# Patient Record
Sex: Female | Born: 1945
Health system: Southern US, Community
[De-identification: ages and names within clinical notes are randomized; demographics above are authoritative.]

## PROBLEM LIST (undated history)

## (undated) DIAGNOSIS — M21372 Foot drop, left foot: Secondary | ICD-10-CM

## (undated) DIAGNOSIS — R2689 Other abnormalities of gait and mobility: Secondary | ICD-10-CM

## (undated) DIAGNOSIS — E559 Vitamin D deficiency, unspecified: Secondary | ICD-10-CM

## (undated) DIAGNOSIS — R413 Other amnesia: Secondary | ICD-10-CM

## (undated) DIAGNOSIS — K219 Gastro-esophageal reflux disease without esophagitis: Secondary | ICD-10-CM

## (undated) HISTORY — DX: Other amnesia: R41.3

## (undated) HISTORY — PX: CYST EXCISION: SHX5701

## (undated) HISTORY — DX: Foot drop, left foot: M21.372

## (undated) HISTORY — DX: Vitamin D deficiency, unspecified: E55.9

## (undated) HISTORY — DX: Gastro-esophageal reflux disease without esophagitis: K21.9

## (undated) HISTORY — DX: Other abnormalities of gait and mobility: R26.89

## (undated) HISTORY — PX: APPENDECTOMY: SHX54

---

## 1986-12-07 HISTORY — PX: PLACEMENT OF BREAST IMPLANTS: SHX6334

## 1998-04-12 ENCOUNTER — Other Ambulatory Visit: Admission: RE | Admit: 1998-04-12 | Discharge: 1998-04-12 | Payer: Self-pay | Admitting: *Deleted

## 2000-07-02 ENCOUNTER — Other Ambulatory Visit: Admission: RE | Admit: 2000-07-02 | Discharge: 2000-07-02 | Payer: Self-pay | Admitting: Family Medicine

## 2003-10-05 ENCOUNTER — Other Ambulatory Visit: Admission: RE | Admit: 2003-10-05 | Discharge: 2003-10-05 | Payer: Self-pay | Admitting: Family Medicine

## 2005-04-03 ENCOUNTER — Ambulatory Visit: Payer: Self-pay | Admitting: Family Medicine

## 2005-10-28 ENCOUNTER — Ambulatory Visit: Payer: Self-pay | Admitting: Family Medicine

## 2008-06-20 ENCOUNTER — Encounter: Payer: Self-pay | Admitting: Internal Medicine

## 2008-06-27 ENCOUNTER — Telehealth (INDEPENDENT_AMBULATORY_CARE_PROVIDER_SITE_OTHER): Payer: Self-pay | Admitting: *Deleted

## 2009-07-25 ENCOUNTER — Ambulatory Visit: Payer: Self-pay | Admitting: Family Medicine

## 2009-07-25 DIAGNOSIS — D649 Anemia, unspecified: Secondary | ICD-10-CM | POA: Insufficient documentation

## 2009-07-25 DIAGNOSIS — N952 Postmenopausal atrophic vaginitis: Secondary | ICD-10-CM | POA: Insufficient documentation

## 2009-07-25 DIAGNOSIS — E785 Hyperlipidemia, unspecified: Secondary | ICD-10-CM | POA: Insufficient documentation

## 2009-07-29 LAB — CONVERTED CEMR LAB
AST: 18 units/L (ref 0–37)
Albumin: 4.3 g/dL (ref 3.5–5.2)
Alkaline Phosphatase: 55 units/L (ref 39–117)
Basophils Relative: 0.1 % (ref 0.0–3.0)
Bilirubin, Direct: 0.1 mg/dL (ref 0.0–0.3)
CO2: 30 meq/L (ref 19–32)
Calcium: 9.4 mg/dL (ref 8.4–10.5)
Creatinine, Ser: 0.6 mg/dL (ref 0.4–1.2)
Eosinophils Relative: 0.4 % (ref 0.0–5.0)
GFR calc non Af Amer: 106.98 mL/min (ref >60)
Hemoglobin: 13.4 g/dL (ref 12.0–15.0)
Lymphocytes Relative: 29.9 % (ref 12.0–46.0)
Monocytes Relative: 5.1 % (ref 3.0–12.0)
Neutro Abs: 4.5 10*3/uL (ref 1.4–7.7)
Neutrophils Relative %: 64.5 % (ref 43.0–77.0)
RBC: 4.22 M/uL (ref 3.87–5.11)
Sodium: 138 meq/L (ref 135–145)
Total CHOL/HDL Ratio: 4
Total Protein: 7.4 g/dL (ref 6.0–8.3)
Triglycerides: 105 mg/dL (ref 0.0–149.0)
VLDL: 21 mg/dL (ref 0.0–40.0)
WBC: 7 10*3/uL (ref 4.5–10.5)

## 2009-10-09 ENCOUNTER — Ambulatory Visit: Payer: Self-pay | Admitting: Family Medicine

## 2009-11-19 ENCOUNTER — Ambulatory Visit: Payer: Self-pay | Admitting: Family Medicine

## 2009-11-20 ENCOUNTER — Encounter: Payer: Self-pay | Admitting: Family Medicine

## 2009-12-02 ENCOUNTER — Encounter (INDEPENDENT_AMBULATORY_CARE_PROVIDER_SITE_OTHER): Payer: Self-pay | Admitting: *Deleted

## 2009-12-10 ENCOUNTER — Telehealth: Payer: Self-pay | Admitting: Family Medicine

## 2010-01-03 ENCOUNTER — Telehealth: Payer: Self-pay | Admitting: Family Medicine

## 2010-01-15 ENCOUNTER — Ambulatory Visit: Payer: Self-pay | Admitting: Family Medicine

## 2010-02-25 ENCOUNTER — Ambulatory Visit: Payer: Self-pay | Admitting: Family Medicine

## 2010-02-25 LAB — CONVERTED CEMR LAB
ALT: 14 units/L (ref 0–35)
Cholesterol: 240 mg/dL — ABNORMAL HIGH (ref 0–200)
Direct LDL: 162.9 mg/dL
Total CHOL/HDL Ratio: 5
VLDL: 40.8 mg/dL — ABNORMAL HIGH (ref 0.0–40.0)

## 2010-03-04 ENCOUNTER — Ambulatory Visit: Payer: Self-pay | Admitting: Family Medicine

## 2010-03-04 DIAGNOSIS — J310 Chronic rhinitis: Secondary | ICD-10-CM | POA: Insufficient documentation

## 2010-03-11 ENCOUNTER — Ambulatory Visit: Payer: Self-pay | Admitting: Family Medicine

## 2010-03-12 LAB — CONVERTED CEMR LAB: Fecal Occult Bld: NEGATIVE

## 2010-03-25 ENCOUNTER — Telehealth: Payer: Self-pay | Admitting: Family Medicine

## 2010-03-25 ENCOUNTER — Encounter: Payer: Self-pay | Admitting: Family Medicine

## 2010-07-03 ENCOUNTER — Ambulatory Visit: Payer: Self-pay | Admitting: Family Medicine

## 2010-07-04 LAB — CONVERTED CEMR LAB
ALT: 18 units/L (ref 0–35)
AST: 20 units/L (ref 0–37)
Direct LDL: 171.5 mg/dL
HDL: 51.8 mg/dL (ref >39.00)
VLDL: 35.8 mg/dL (ref 0.0–40.0)

## 2010-12-05 ENCOUNTER — Telehealth: Payer: Self-pay | Admitting: Family Medicine

## 2011-01-08 NOTE — Assessment & Plan Note (Signed)
Summary: CONGESTION,BODY ACHES,COUGH/CLE   Vital Signs:  Patient profile:   65 year old female Height:      64.5 inches Weight:      123.50 pounds BMI:     20.95 Temp:     97.9 degrees F oral Pulse rate:   84 / minute Pulse rhythm:   regular BP sitting:   118 / 74  (left arm) Cuff size:   regular  Vitals Entered By: Lewanda Rife LPN (January 15, 2010 1:02 PM)  CC:  chest congestion, body aches, and fever.  History of Present Illness: started sunday with a terrible sore throat in the back  hurt bad to cough- now that is improved fever 101 now - and felt achey and chilled  cough is occas productive -- does sound that way / phlegm is clear  nose is a little runny  ears do no hurt  no n/v/d   grandchildren and father were sick -- exp to a lot     Allergies (verified): No Known Drug Allergies  Past History:  Past Medical History: Last updated: 07/25/2009 Anemia-NOS  Past Surgical History: Last updated: 07/25/2009 Tonsillectomy colposcopy - was normal prior to hyst  Hysterectomy (1998)partial for fibroids ,  cyst in parotid (1986)- L side  breast augmentation (1988)  Family History: Last updated: 07/25/2009 Family History of Arthritis (mother) Family History Diabetes 1st degree relative (MGM) Family History High cholesterol (mother) Family History Hypertension (both parents) Family History Stroke (brother, Maternal grandparent) Family History Heart Disease (Maternal grandparent)  brother - stroke at 20 , and HTN   no cancer in family   Social History: Last updated: 07/25/2009 Retired Married G4P4 Never Smoked Alcohol use-yes- 3-4 times per week  Drug use-yes Regular exercise-yes walks every day - with her dog   Risk Factors: Exercise: yes (07/25/2009)  Risk Factors: Smoking Status: never (07/25/2009)  Review of Systems General:  Complains of chills, fever, loss of appetite, and malaise. Eyes:  Denies blurring and eye irritation. CV:  Denies  chest pain or discomfort and palpitations. Resp:  Complains of cough and sputum productive; denies pleuritic, shortness of breath, and wheezing. GI:  Denies diarrhea, nausea, and vomiting. Derm:  Denies rash.  Physical Exam  General:  fatigued appearing  Head:  normocephalic, atraumatic, and no abnormalities observed.  no sinus tenderness  Eyes:  vision grossly intact, pupils equal, pupils round, pupils reactive to light, and no injection.   Ears:  R ear normal and L ear normal.   Nose:  nares injeted and congested  Mouth:  pharynx pink and moist, no erythema, no exudates, and postnasal drip.   Neck:  No deformities, masses, or tenderness noted. Lungs:  Normal respiratory effort, chest expands symmetrically. Lungs are clear to auscultation, no crackles or wheezes. Heart:  Normal rate and regular rhythm. S1 and S2 normal without gallop, murmur, click, rub or other extra sounds. Skin:  Intact without suspicious lesions or rashes Cervical Nodes:  No lymphadenopathy noted Psych:  normal affect, talkative and pleasant    Impression & Recommendations:  Problem # 1:  INFLUENZA, WITH RESPIRATORY SYMPTOMS (ICD-487.1) Assessment New in pt who had flu shot  out of window for tamiflu will control symptoms with guifen- cod cough syrup recommend sympt care- see pt instructions   pt advised to update me if symptoms worsen or do not improve - esp if worse prod cough or fever   Complete Medication List: 1)  Aspirin 81 Mg Tabs (Aspirin) .... Take 1 tablet  by mouth once a day 2)  Multivitamins Tabs (Multiple vitamin) .... By mouth daily 3)  Vitamin E 400 Unit Caps (vitamin E)  .... By mouth daily 4)  Vitamin D 2000 Unit Tabs (Cholecalciferol) .... Take 1 tablet by mouth once a day 5)  Calcium 1500 Mg Tabs (Calcium carbonate) .... Take 1 tablet by mouth once a day 6)  Omega-3 Cf 1000 Mg Caps (Omega-3 fatty acids) .... 3 caps by mouth daily 7)  Premarin 0.625 Mg/gm Crea (Estrogens, conjugated) ....  Use as directed intravaginally .5 g 2-3 times per week 8)  Flonase 50 Mcg/act Susp (Fluticasone propionate) .... 2 sprays in each nostril once daily as needed 9)  Ibuprofen 200 Mg Tabs (Ibuprofen) .... Otc as directed. 10)  Guaifenesin-codeine 100-10 Mg/49ml Soln (Guaifenesin-codeine) .Marland Kitchen.. 1-2 teaspoons by mouth up to every 4 hours as needed cough  Patient Instructions: 1)  get lots of fluids and rest  2)  nasal saline spray or netti pot is good for congestion 3)  use your flonase daily for next 1-2 weeks  4)  try the guifen- codiene cough syrup with caution because it can sedate  5)  tylenol  and ibuprofen over the counter as directed may help with aches, headache and fever 6)  call if symptoms worsen or if not improved in 4-5 days  Prescriptions: GUAIFENESIN-CODEINE 100-10 MG/5ML SOLN (GUAIFENESIN-CODEINE) 1-2 teaspoons by mouth up to every 4 hours as needed cough  #120cc x 0   Entered and Authorized by:   Judith Part MD   Signed by:   Judith Part MD on 01/15/2010   Method used:   Print then Give to Patient   RxID:   [id number]   Current Allergies (reviewed today): No known allergies

## 2011-01-08 NOTE — Progress Notes (Signed)
Summary: please clarify instructions for premarin  Phone Note From Pharmacy   Caller: [facility] [phone] [place] Dec) Summary of Call: Pharmacy need clarification on the instructions for pt's premarin cream.  They need the gram weight that she is to use 2-3 times a week. Initial call taken by: Lowella Petties CMA,  January 03, 2010 4:14 PM  Follow-up for Phone Call        I think it is dispensed in 42.5 grams  let me know if that is not correct Follow-up by: Judith Part MD,  January 03, 2010 4:35 PM  Additional Follow-up for Phone Call Additional follow up Details #1::        Thats right for the tube size but they need to know the exact amount that the pt is to use each time.  The instructions only say a small amount.  Please advise.  thanks-- .5 grams 2-3 times per week--MT Additional Follow-up by: Lowella Petties CMA,  January 03, 2010 4:44 PM    Additional Follow-up for Phone Call Additional follow up Details #2::    Advised pharmacist. Follow-up by: Lowella Petties CMA,  January 04, 2010 9:45 AM  New/Updated Medications: PREMARIN 0.625 MG/GM CREA (ESTROGENS, CONJUGATED) use as directed intravaginally .5 g 2-3 times per week

## 2011-01-08 NOTE — Letter (Signed)
Summary: [place] [facility]: Immunoassay Fecal Occult Blood (iFOB) Order Form  [name] at [facility]  [address] [place], [place] [address]   Phone: [phone]  Fax: [phone]      [facility]: Immunoassay Fecal Occult Blood (iFOB) Order Form   March 04, 2010 MRN: [id number]   Carol Chavez 18-Mar-1946   Physicican Name:_____Tower____________________  Diagnosis Code:___________V76.41_______________      Judith Part MD

## 2011-01-08 NOTE — Progress Notes (Signed)
Summary: Sinus/Ears  Phone Note Call from Patient Call back at Home Phone [phone] Call back at cell [phone]   Caller: Patient Call For: Judith Part MD Summary of Call: pt states she seen you back in Dec for sinus infection which is better, but her left ear is still stopped up. Pt would like to know if she should see ENT? I asked pt if she was using nasal spray and she said no. Please advise Initial call taken by: Mervin Hack CMA Duncan Dull),  December 10, 2009 10:20 AM  Follow-up for Phone Call        I want her to try some flonase ns for a week and then update me again-- if not imp will ref to ENT update me if ear pain or fever develop px written on EMR for call in  Follow-up by: Judith Part MD,  December 10, 2009 10:46 AM  Additional Follow-up for Phone Call Additional follow up Details #1::        Advised pt, med called to [facility]. Additional Follow-up by: Lowella Petties CMA,  December 10, 2009 11:03 AM    New/Updated Medications: FLONASE 50 MCG/ACT SUSP (FLUTICASONE PROPIONATE) 2 sprays in each nostril once daily Prescriptions: FLONASE 50 MCG/ACT SUSP (FLUTICASONE PROPIONATE) 2 sprays in each nostril once daily  #1 mdi x 1   Entered and Authorized by:   Judith Part MD   Signed by:   Lowella Petties CMA on 12/10/2009   Method used:   Telephoned to ...         RxID:   [id number]

## 2011-01-08 NOTE — Assessment & Plan Note (Signed)
Summary: CPX.Marland KitchenCYD   Vital Signs:  Patient profile:   65 year old female Height:      64.5 inches Weight:      124.25 pounds BMI:     21.07 Temp:     98.3 degrees F oral Pulse rate:   84 / minute Pulse rhythm:   regular BP sitting:   124 / 80  (left arm) Cuff size:   regular  Vitals Entered By: Lewanda Rife LPN (March 04, 2010 2:33 PM) CC: complete physical LMP Hyst 1998   History of Present Illness: here for health mt exam and to review chronic med problems   is feeling good overall - no new complaints   occasional sinus headaches - needs to stay on flonase   wt is stable with bmi 21- good  bp 124/80- also good   partial hyst in past no symptoms or problems  pap 07  mam 12/10- was normal  self exam - no lumps or changes   bone density-- has never had  takes her ca and vit D   TD 05 did get her flu shot this year   colon screen - has not had colonosc yet  may consider next fall  will do stool screen   recent lipids higher with trig 204/ HDL 49 and LDL 162- up from the 140s  tries to eat a healthy diet  thinks this is genetic    shingles - had the vaccine yesterday - at a pharmacy   Allergies (verified): No Known Drug Allergies  Past History:  Past Medical History: Last updated: 07/25/2009 Anemia-NOS  Family History: Last updated: 07/25/2009 Family History of Arthritis (mother) Family History Diabetes 1st degree relative (MGM) Family History High cholesterol (mother) Family History Hypertension (both parents) Family History Stroke (brother, Maternal grandparent) Family History Heart Disease (Maternal grandparent)  brother - stroke at 24 , and HTN   no cancer in family   Social History: Last updated: 07/25/2009 Retired Married G4P4 Never Smoked Alcohol use-yes- 3-4 times per week  Drug use-yes Regular exercise-yes walks every day - with her dog   Risk Factors: Exercise: yes (07/25/2009)  Risk Factors: Smoking Status: never  (07/25/2009)  Past Surgical History: Tonsillectomy colposcopy - was normal prior to hyst  Hysterectomy (1998)partial for fibroids ,  cyst in parotid (1986)- L side  breast augmentation (1988) cataract surgery 2010   Review of Systems General:  Denies fatigue, fever, loss of appetite, and malaise. Eyes:  Denies blurring and eye irritation. ENT:  Complains of nasal congestion, postnasal drainage, and sinus pressure; denies earache and sore throat. CV:  Denies chest pain or discomfort and palpitations. Resp:  Denies cough and wheezing. GI:  Denies abdominal pain, bloody stools, change in bowel habits, and indigestion. MS:  Denies joint pain, joint redness, and joint swelling. Derm:  Denies itching, lesion(s), poor wound healing, and rash. Neuro:  Complains of headaches; denies numbness and tingling. Psych:  Denies anxiety and depression. Endo:  Denies cold intolerance, excessive thirst, excessive urination, and heat intolerance. Heme:  Denies abnormal bruising and bleeding.  Physical Exam  General:  fatigued appearing  Head:  normocephalic, atraumatic, and no abnormalities observed.  no sinus tenderness  Eyes:  vision grossly intact, pupils equal, pupils round, pupils reactive to light, and no injection.   Ears:  R ear normal and L ear normal.   Nose:  nares are boggy and mildly congested  Mouth:  pharynx pink and moist, no erythema, no exudates, and postnasal drip.  Neck:  supple with full rom and no masses or thyromegally, no JVD or carotid bruit  Chest Wall:  No deformities, masses, or tenderness noted. Breasts:  No mass, nodules, thickening, tenderness, bulging, retraction, inflamation, nipple discharge or skin changes noted.  (implants noted) Lungs:  Normal respiratory effort, chest expands symmetrically. Lungs are clear to auscultation, no crackles or wheezes. Heart:  Normal rate and regular rhythm. S1 and S2 normal without gallop, murmur, click, rub or other extra  sounds. Abdomen:  Bowel sounds positive,abdomen soft and non-tender without masses, organomegaly or hernias noted. no renal bruits  Msk:  No deformity or scoliosis noted of thoracic or lumbar spine.  some mild changes of DIP OA in hands  Pulses:  R and L carotid,radial,femoral,dorsalis pedis and posterior tibial pulses are full and equal bilaterally Extremities:  No clubbing, cyanosis, edema, or deformity noted with normal full range of motion of all joints.   Neurologic:  sensation intact to light touch, gait normal, and DTRs symmetrical and normal.   Skin:  Intact without suspicious lesions or rashes Cervical Nodes:  No lymphadenopathy noted Axillary Nodes:  No palpable lymphadenopathy Inguinal Nodes:  No significant adenopathy Psych:  normal affect, talkative and pleasant    Impression & Recommendations:  Problem # 1:  HEALTH MAINTENANCE EXAM (ICD-V70.0) Assessment Comment Only reviewed health habits including diet, exercise and skin cancer prevention reviewed health maintenance list and family history overall good habits  imms up to date  stool immunoassay for screen  she may consider colonosc in future- disc pros and cons sched fasting wellness lab 6 wk  Problem # 2:  HYPERLIPIDEMIA, MILD (ICD-272.4) Assessment: Deteriorated  cholesterol up - suspect familial will try zocor 20 and update if side eff lab 6 wk rev low sat fat diet and handout given from [facility] also  Her updated medication list for this problem includes:    Zocor 20 Mg Tabs (Simvastatin) .Marland Kitchen... 1 by mouth each evening with a low fat snack  Labs Reviewed: SGOT: 17 (02/25/2010)   SGPT: 14 (02/25/2010)   HDL:49.70 (02/25/2010), 48.50 (07/25/2009)  Chol:240 (02/25/2010), 211 (07/25/2009)  Trig:204.0 (02/25/2010), 105.0 (07/25/2009)  Problem # 3:  RHINITIS (ICD-472.0) Assessment: Deteriorated becoming more year around with congestion and ETD willl inc flonase to daily use and update if not imp  Complete  Medication List: 1)  Aspirin 81 Mg Tabs (Aspirin) .... Take 1 tablet by mouth once a day 2)  Multivitamins Tabs (Multiple vitamin) .... By mouth daily 3)  Vitamin E 400 Unit Caps (vitamin E)  .... By mouth daily 4)  Vitamin D 2000 Unit Tabs (Cholecalciferol) .... Take 1 tablet by mouth once a day 5)  Calcium 1500 Mg Tabs (Calcium carbonate) .... Take 1 tablet by mouth once a day 6)  Omega-3 Cf 1000 Mg Caps (Omega-3 fatty acids) .... 3 caps by mouth daily 7)  Premarin 0.625 Mg/gm Crea (Estrogens, conjugated) .... Use as directed intravaginally .5 g 2-3 times per week 8)  Flonase 50 Mcg/act Susp (Fluticasone propionate) .... 2 sprays in each nostril once daily as needed 9)  Ibuprofen 200 Mg Tabs (Ibuprofen) .... Otc as directed. 10)  Zocor 20 Mg Tabs (Simvastatin) .Marland Kitchen.. 1 by mouth each evening with a low fat snack  Patient Instructions: 1)  try zocor 20 mg one pill each evening for hereditary high cholesterol  2)  if any side effects or problems- stop it and let me know  3)  schedule fasting labs in 6 weeks wellness/ lipid v70/0  and 272  4)  the current recommendation for calcium intake is 1200-1500 mg daily with [phone] IU of vitamin D  5)  next november - call us to schedule your mammogram and also first dexa (bone density test)  Prescriptions: ZOCOR 20 MG TABS (SIMVASTATIN) 1 by mouth each evening with a low fat snack  #30 x 11   Entered and Authorized by:   Judith Part MD   Signed by:   Judith Part MD on 03/04/2010   Method used:   Print then Give to Patient   RxID:   [id number] FLONASE 50 MCG/ACT SUSP (FLUTICASONE PROPIONATE) 2 sprays in each nostril once daily as needed  #1 mdi x 11   Entered and Authorized by:   Judith Part MD   Signed by:   Judith Part MD on 03/04/2010   Method used:   Print then Give to Patient   RxID:   [id number]   Current Allergies (reviewed today): No known allergies      Other Immunization History:    Zostavax # 1:   Zostavax (03/03/2010)

## 2011-01-08 NOTE — Progress Notes (Signed)
Summary: Premarin .625mg   Phone Note Refill Request Call back at [phone] Message from:  [facility] on December 05, 2010 3:27 PM  Refills Requested: Medication #1:  PREMARIN 0.625 MG/GM CREA use as directed intravaginally .5 g 2-3 times per week [facility] electronically requested refill for Premarin0.625 cream. No last refill date sent. Med is on med list but I could not see where we had prescribed before. Pt had CPX 03/04/10 Please advise.    Method Requested: Electronic Initial call taken by: Lewanda Rife LPN,  December 05, 2010 3:29 PM  Follow-up for Phone Call        px written on EMR for call in  Follow-up by: Judith Part MD,  December 05, 2010 3:50 PM    New/Updated Medications: PREMARIN 0.625 MG/GM CREA (ESTROGENS, CONJUGATED) use as directed intravaginally .5 g 2-3 times per week Prescriptions: PREMARIN 0.625 MG/GM CREA (ESTROGENS, CONJUGATED) use as directed intravaginally .5 g 2-3 times per week  #3 months x 3   Entered by:   Lowella Petties CMA, AAMA   Authorized by:   Judith Part MD   Signed by:   Lowella Petties CMA, AAMA on 12/05/2010   Method used:   Electronically to        [facility]       [address]       [place], [place]  [address]       Ph: [phone]       Fax: [phone]   RxID:   [id number]

## 2011-01-08 NOTE — Miscellaneous (Signed)
Summary: Zocor update  Clinical Lists Changes  Medications: Removed medication of ZOCOR 20 MG TABS (SIMVASTATIN) 1 by mouth each evening with a low fat snack     Current Allergies: No known allergies

## 2011-01-08 NOTE — Progress Notes (Signed)
Summary: didnt start zocor  Phone Note Call from Patient Call back at Home Phone [phone] Call back at [phone]   Caller: Patient Summary of Call: Pt has decided not to start zocor, so she cancelled her lab appt for may.  She is asking if she can have fasting labs drawn in july.  Please order. Initial call taken by: Lowella Petties CMA,  March 25, 2010 11:07 AM  Follow-up for Phone Call        ok- will remove from med list watch diet  labs for july lipid/ast/alt 272  Follow-up by: Judith Part MD,  March 25, 2010 11:14 AM  Additional Follow-up for Phone Call Additional follow up Details #1::        Patient notified as instructed by telephone. Fasting lab appointment scheduled as instructed 06/16/10 at 8:10am.removed Zocor from med list as instructed. Lewanda Rife LPN  March 25, 2010 3:56 PM

## 2012-08-09 ENCOUNTER — Ambulatory Visit (INDEPENDENT_AMBULATORY_CARE_PROVIDER_SITE_OTHER): Payer: MEDICARE | Admitting: Internal Medicine

## 2012-08-09 ENCOUNTER — Ambulatory Visit: Payer: Self-pay

## 2012-08-09 ENCOUNTER — Encounter: Payer: Self-pay | Admitting: Internal Medicine

## 2012-08-09 VITALS — BP 136/89 | HR 101 | Temp 97.9°F | Resp 24 | Ht 64.5 in | Wt 124.0 lb

## 2012-08-09 DIAGNOSIS — M79673 Pain in unspecified foot: Secondary | ICD-10-CM

## 2012-08-09 DIAGNOSIS — M25569 Pain in unspecified knee: Secondary | ICD-10-CM

## 2012-08-09 DIAGNOSIS — Z23 Encounter for immunization: Secondary | ICD-10-CM

## 2012-08-09 DIAGNOSIS — M79643 Pain in unspecified hand: Secondary | ICD-10-CM

## 2012-08-09 DIAGNOSIS — IMO0002 Reserved for concepts with insufficient information to code with codable children: Secondary | ICD-10-CM

## 2012-08-09 DIAGNOSIS — M79609 Pain in unspecified limb: Secondary | ICD-10-CM

## 2012-08-09 DIAGNOSIS — T148XXA Other injury of unspecified body region, initial encounter: Secondary | ICD-10-CM

## 2012-08-09 MED ORDER — MUPIROCIN 2 % EX OINT: TOPICAL_OINTMENT | Freq: Three times a day (TID) | CUTANEOUS | Status: AC

## 2012-08-09 MED ORDER — HYDROCODONE-ACETAMINOPHEN 5-500 MG PO TABS: 1.0000 | ORAL_TABLET | Freq: Three times a day (TID) | ORAL | Status: AC | PRN
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
## 2012-08-09 NOTE — Patient Instructions (Signed)
Abrasions Abrasions are skin scrapes. Their treatment depends on how large and deep the abrasion is. Abrasions do not extend through all layers of the skin. A cut or lesion through all skin layers is called a laceration. HOME CARE INSTRUCTIONS   If you were given a dressing, change it at least once a day or as instructed by your caregiver. If the bandage sticks, soak it off with a solution of water or hydrogen peroxide.   Twice a day, wash the area with soap and water to remove all the cream/ointment. You may do this in a sink, under a tub faucet, or in a shower. Rinse off the soap and pat dry with a clean towel. Look for signs of infection (see below).   Reapply cream/ointment according to your caregiver's instruction. This will help prevent infection and keep the bandage from sticking. Telfa or gauze over the wound and under the dressing or wrap will also help keep the bandage from sticking.   If the bandage becomes wet, dirty, or develops a foul smell, change it as soon as possible.   Only take over-the-counter or prescription medicines for pain, discomfort, or fever as directed by your caregiver.  SEEK IMMEDIATE MEDICAL CARE IF:   Increasing pain in the wound.   Signs of infection develop: redness, swelling, surrounding area is tender to touch, or pus coming from the wound.   You have a fever.   Any foul smell coming from the wound or dressing.  Most skin wounds heal within ten days. Facial wounds heal faster. However, an infection may occur despite proper treatment. You should have the wound checked for signs of infection within 24 to 48 hours or sooner if problems arise. If you were not given a wound-check appointment, look closely at the wound yourself on the second day for early signs of infection listed above. MAKE SURE YOU:   Understand these instructions.   Will watch your condition.   Will get help right away if you are not doing well or get worse.  Document Released:  09/02/2005 Document Revised: 11/12/2011 Document Reviewed: 10/27/2011 [employer] Patient Information 2012 [employer]. 

## 2012-08-09 NOTE — Progress Notes (Signed)
Verbal consent obtained. Wounds along bilateral posterior shoulders, elbows, left anterior chest, left forearm, bilateral wrists, hands, both dorsal and volar surfaces, right knee, bilateral feet, and toes debrided. No deep structures involved in any of the above sites. Full sensation and ROM observed and exhibited in the above locations. Patient was given Xanax 0.25 mg and Lortab Elixer 10 cc during the procedure. The above wounds were also washed with chloroscrub and rinsed. Wounds dressed, xeroform applied to wounds along right posterior shoulder, left volar hand, and right foot. Patient to perform daily dressing changes and follow up in three days.   Eula Listen, PA-C 08/09/2012 12:31 PM

## 2012-08-12 ENCOUNTER — Ambulatory Visit (INDEPENDENT_AMBULATORY_CARE_PROVIDER_SITE_OTHER): Payer: Medicare Other | Admitting: Family Medicine

## 2012-08-12 VITALS — BP 124/78 | HR 89 | Temp 98.2°F | Resp 16 | Ht 64.0 in | Wt 128.0 lb

## 2012-08-12 DIAGNOSIS — T1490XA Injury, unspecified, initial encounter: Secondary | ICD-10-CM
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
## 2012-11-24 ENCOUNTER — Encounter: Payer: Self-pay | Admitting: Family Medicine

## 2012-11-28 ENCOUNTER — Encounter: Payer: Self-pay | Admitting: *Deleted

## 2013-04-04 ENCOUNTER — Other Ambulatory Visit: Payer: Self-pay

## 2013-05-25 ENCOUNTER — Telehealth: Payer: Self-pay | Admitting: Family Medicine

## 2013-05-25 DIAGNOSIS — D649 Anemia, unspecified: Secondary | ICD-10-CM

## 2013-05-25 DIAGNOSIS — E785 Hyperlipidemia, unspecified: Secondary | ICD-10-CM

## 2013-05-25 DIAGNOSIS — Z0000 Encounter for general adult medical examination without abnormal findings: Secondary | ICD-10-CM

## 2013-05-25 NOTE — Telephone Encounter (Signed)
Message copied by Judy Pimple on Thu May 25, 2013  4:06 PM ------      Message from: Alvina Chou      Created: Tue May 23, 2013  2:45 PM      Regarding: Lab orders for Tuesday, July 1.14       Patient is scheduled for CPX labs, please order future labs, Thanks , Terri             ------

## 2013-06-06 ENCOUNTER — Other Ambulatory Visit (INDEPENDENT_AMBULATORY_CARE_PROVIDER_SITE_OTHER): Payer: Medicare Other

## 2013-06-06 DIAGNOSIS — D649 Anemia, unspecified: Secondary | ICD-10-CM

## 2013-06-06 DIAGNOSIS — E785 Hyperlipidemia, unspecified: Secondary | ICD-10-CM

## 2013-06-06 DIAGNOSIS — Z0000 Encounter for general adult medical examination without abnormal findings: Secondary | ICD-10-CM

## 2013-06-06 LAB — CBC WITH DIFFERENTIAL/PLATELET
Basophils Absolute: 0 10*3/uL (ref 0.0–0.1)
Eosinophils Relative: 1.4 % (ref 0.0–5.0)
HCT: 39.4 % (ref 36.0–46.0)
Lymphocytes Relative: 45.3 % (ref 12.0–46.0)
Lymphs Abs: 2.5 10*3/uL (ref 0.7–4.0)
Monocytes Relative: 8.5 % (ref 3.0–12.0)
Neutrophils Relative %: 44.2 % (ref 43.0–77.0)
Platelets: 203 10*3/uL (ref 150.0–400.0)
RDW: 13.8 % (ref 11.5–14.6)
WBC: 5.5 10*3/uL (ref 4.5–10.5)

## 2013-06-06 LAB — COMPREHENSIVE METABOLIC PANEL
ALT: 15 U/L (ref 0–35)
Albumin: 4.3 g/dL (ref 3.5–5.2)
CO2: 25 mEq/L (ref 19–32)
Calcium: 9.2 mg/dL (ref 8.4–10.5)
Chloride: 99 mEq/L (ref 96–112)
GFR: 91.5 mL/min (ref >60.00)
Glucose, Bld: 102 mg/dL — ABNORMAL HIGH (ref 70–99)
Potassium: 4.2 mEq/L (ref 3.5–5.1)
Sodium: 134 mEq/L — ABNORMAL LOW (ref 135–145)
Total Bilirubin: 0.7 mg/dL (ref 0.3–1.2)
Total Protein: 7.3 g/dL (ref 6.0–8.3)

## 2013-06-06 LAB — LDL CHOLESTEROL, DIRECT: Direct LDL: 165.9 mg/dL

## 2013-06-06 LAB — LIPID PANEL
Cholesterol: 246 mg/dL — ABNORMAL HIGH (ref 0–200)
VLDL: 33.8 mg/dL (ref 0.0–40.0)

## 2013-06-13 ENCOUNTER — Ambulatory Visit (INDEPENDENT_AMBULATORY_CARE_PROVIDER_SITE_OTHER): Payer: Medicare Other | Admitting: Family Medicine

## 2013-06-13 ENCOUNTER — Encounter: Payer: Self-pay | Admitting: Family Medicine

## 2013-06-13 VITALS — BP 124/72 | HR 92 | Temp 98.7°F | Ht 64.25 in | Wt 126.8 lb

## 2013-06-13 DIAGNOSIS — Z0000 Encounter for general adult medical examination without abnormal findings: Secondary | ICD-10-CM

## 2013-06-13 DIAGNOSIS — E785 Hyperlipidemia, unspecified: Secondary | ICD-10-CM

## 2013-06-13 DIAGNOSIS — N952 Postmenopausal atrophic vaginitis: Secondary | ICD-10-CM

## 2013-06-13 DIAGNOSIS — Z23 Encounter for immunization: Secondary | ICD-10-CM

## 2013-06-13 DIAGNOSIS — Z1211 Encounter for screening for malignant neoplasm of colon: Secondary | ICD-10-CM

## 2013-06-13 MED ORDER — FLUTICASONE PROPIONATE 50 MCG/ACT NA SUSP: 2.0000 | Freq: Every day | NASAL | Status: DC

## 2013-06-13 MED ORDER — ESTROGENS, CONJUGATED 0.625 MG/GM VA CREA: TOPICAL_CREAM | Freq: Every day | VAGINAL | Status: DC

## 2013-06-13 NOTE — Assessment & Plan Note (Signed)
Pt declines colonoscopy - but will do IFOB card  No stool changes or symptoms

## 2013-06-13 NOTE — Assessment & Plan Note (Signed)
Pt was given premarin vaginal cream for prn use - twice weekly small amt -disc poss side effects  Will update if not helpful

## 2013-06-13 NOTE — Assessment & Plan Note (Signed)
Reviewed health habits including diet and exercise and skin cancer prevention Also reviewed health mt list, fam hx and immunizations  See HPI Wellness labs rev in detail

## 2013-06-13 NOTE — Assessment & Plan Note (Signed)
Pt declines medication for this Disc goals for lipids and reasons to control them Rev labs with pt Rev low sat fat diet in detail  Handout given as well

## 2013-06-13 NOTE — Progress Notes (Signed)
Subjective:    Patient ID: Carol Chavez, female    DOB: 01/17/1946, 67 y.o.   MRN: [id number]  HPI I have personally reviewed the Medicare Annual Wellness questionnaire and have noted 1. The patient's medical and social history 2. Their use of alcohol, tobacco or illicit drugs 3. Their current medications and supplements 4. The patient's functional ability including ADL's, fall risks, home safety risks and hearing or visual             impairment. 5. Diet and physical activities 6. Evidence for depression or mood disorders  The patients weight, height, BMI have been recorded in the chart and visual acuity is per eye clinic.  I have made referrals, counseling and provided education to the patient based review of the above and I have provided the pt with a written personalized care plan for preventive services.  See scanned forms.  Routine anticipatory guidance given to patient.  See health maintenance. Flu- got a shot this fall  Shingles 3/11 vaccine PNA- will get that today Tetanus 9/13 vaccine Colon -never had a colonoscopy - declines that - and will do IFOB instead   Breast cancer screening-mammogram 12/13  No lumps on self exam  Gyn exam - has been since 2007  No gyn symptoms (baseline atrophic vag - not using her premarin cream)- because she is not sexually active, no new partners (she wants to avoid a speculum exam due to pain from vaginal dryness) Wants to refill premarin cream in case she does become sexually active again    Advance directive- does not have , will consider working on a living will  Cognitive function addressed- see scanned forms- and if abnormal then additional documentation follows. -no concerns at all with that   PMH and SH reviewed  Meds, vitals, and allergies reviewed.   ROS: See HPI.  Otherwise negative.     Falls none   Mood good overall / no depression or lack of motivation   Labs  Sodium level 134 - had a lot of water that am  Sugar 102    Cholesterol Lab Results  Component Value Date   CHOL 246* 06/06/2013   CHOL 257* 07/03/2010   CHOL 240* 02/25/2010   Lab Results  Component Value Date   HDL 45.60 06/06/2013   HDL 51.80 07/03/2010   HDL 40.98 02/25/2010   No results found for this basename: LDLCALC   Lab Results  Component Value Date   TRIG 169.0* 06/06/2013   TRIG 179.0* 07/03/2010   TRIG 204.0* 02/25/2010   Lab Results  Component Value Date   CHOLHDL 5 06/06/2013   CHOLHDL 5 07/03/2010   CHOLHDL 5 02/25/2010   Lab Results  Component Value Date   LDLDIRECT 165.9 06/06/2013   LDLDIRECT 171.5 07/03/2010   LDLDIRECT 162.9 02/25/2010   was on vacation before she had this checked  She declines medication now or in the future No fast food and not a lot of sugar  occ red meat - 2 times per month, no fried foods , occ eggs, some 2% milk , breakfast meat once per week , some shellfish    Patient Active Problem List   Diagnosis Date Noted  . Encounter for Medicare annual wellness exam 06/13/2013  . RHINITIS 03/04/2010  . HYPERLIPIDEMIA, MILD 07/25/2009  . ANEMIA-NOS 07/25/2009  . VAGINITIS, ATROPHIC, POSTMENOPAUSAL 07/25/2009   No past medical history on file. No past surgical history on file. History  Substance Use Topics  . Smoking status:  Never Smoker   . Smokeless tobacco: Not on file  . Alcohol Use: Yes     Comment: occ   No family history on file. No Known Allergies No current outpatient prescriptions on file prior to visit.   No current facility-administered medications on file prior to visit.    Review of Systems Review of Systems  Constitutional: Negative for fever, appetite change, fatigue and unexpected weight change.  Eyes: Negative for pain and visual disturbance.  Respiratory: Negative for cough and shortness of breath.   Cardiovascular: Negative for cp or palpitations    Gastrointestinal: Negative for nausea, diarrhea and constipation.  Genitourinary: Negative for urgency and frequency.   Skin: Negative for pallor or rash   Neurological: Negative for weakness, light-headedness, numbness and headaches.  Hematological: Negative for adenopathy. Does not bruise/bleed easily.  Psychiatric/Behavioral: Negative for dysphoric mood. The patient is not nervous/anxious.         Objective:   Physical Exam  Constitutional: She appears well-developed and well-nourished. No distress.  HENT:  Head: Normocephalic and atraumatic.  Right Ear: External ear normal.  Left Ear: External ear normal.  Nose: Nose normal.  Mouth/Throat: Oropharynx is clear and moist.  Eyes: Conjunctivae and EOM are normal. Pupils are equal, round, and reactive to light. Right eye exhibits no discharge. Left eye exhibits no discharge. No scleral icterus.  Neck: Normal range of motion. Neck supple. No JVD present. Carotid bruit is not present. No thyromegaly present.  Cardiovascular: Normal rate, regular rhythm, normal heart sounds and intact distal pulses.  Exam reveals no gallop.   Pulmonary/Chest: Effort normal and breath sounds normal. No respiratory distress. She has no wheezes. She has no rales.  Abdominal: Soft. Bowel sounds are normal. She exhibits no distension, no abdominal bruit and no mass. There is no tenderness.  Genitourinary: No breast swelling, tenderness, discharge or bleeding. There is no rash on the right labia. There is no rash on the left labia. No erythema or bleeding around the vagina. No vaginal discharge found.  Atrophic vaginal mucosa noted without ulceration or breakdown - external exam  Breast exam: No mass, nodules, thickening, tenderness, bulging, retraction, inflamation, nipple discharge or skin changes noted.  No axillary or clavicular LA.  Chaperoned exam.    Musculoskeletal: She exhibits no edema and no tenderness.  Lymphadenopathy:    She has no cervical adenopathy.  Neurological: She is alert. She has normal reflexes. No cranial nerve deficit. She exhibits normal muscle tone.  Coordination normal.  Skin: Skin is warm and dry. No rash noted. No erythema. No pallor.  Psychiatric: She has a normal mood and affect.          Assessment & Plan:

## 2013-06-13 NOTE — Patient Instructions (Addendum)
Avoid red meat/ fried foods/ egg yolks/ fatty breakfast meats/ butter, cheese and high fat dairy/ and shellfish   Pneumonia vaccine today  Please do IFOB card for colon cancer screening  Think about working on a living will

## 2013-10-04 ENCOUNTER — Other Ambulatory Visit (INDEPENDENT_AMBULATORY_CARE_PROVIDER_SITE_OTHER): Payer: Medicare Other

## 2013-10-04 DIAGNOSIS — Z1211 Encounter for screening for malignant neoplasm of colon: Secondary | ICD-10-CM

## 2013-10-04 LAB — FECAL OCCULT BLOOD, IMMUNOCHEMICAL: Fecal Occult Bld: NEGATIVE

## 2013-10-12 ENCOUNTER — Other Ambulatory Visit: Payer: Self-pay

## 2014-01-19 ENCOUNTER — Encounter: Payer: Self-pay | Admitting: Family Medicine

## 2014-09-21 ENCOUNTER — Other Ambulatory Visit: Payer: Self-pay

## 2015-06-03 ENCOUNTER — Other Ambulatory Visit: Payer: Self-pay

## 2015-07-18 ENCOUNTER — Encounter: Payer: Self-pay | Admitting: Family Medicine

## 2016-04-26 ENCOUNTER — Telehealth: Payer: Self-pay | Admitting: Family Medicine

## 2016-04-26 DIAGNOSIS — R739 Hyperglycemia, unspecified: Secondary | ICD-10-CM

## 2016-04-26 DIAGNOSIS — R7303 Prediabetes: Secondary | ICD-10-CM | POA: Insufficient documentation

## 2016-04-26 DIAGNOSIS — E785 Hyperlipidemia, unspecified: Secondary | ICD-10-CM

## 2016-04-26 DIAGNOSIS — E871 Hypo-osmolality and hyponatremia: Secondary | ICD-10-CM | POA: Insufficient documentation

## 2016-04-26 NOTE — Telephone Encounter (Signed)
-----   Message from Alvina Chouerri J Walsh sent at 04/24/2016 11:56 AM EDT ----- Regarding: Lab orders for Tuesday, 5.30.17 Lab orders for a f/u

## 2016-05-05 ENCOUNTER — Ambulatory Visit: Payer: Medicare Other

## 2016-05-05 ENCOUNTER — Other Ambulatory Visit (INDEPENDENT_AMBULATORY_CARE_PROVIDER_SITE_OTHER): Payer: Medicare Other

## 2016-05-05 DIAGNOSIS — Z1159 Encounter for screening for other viral diseases: Secondary | ICD-10-CM | POA: Diagnosis not present

## 2016-05-05 DIAGNOSIS — R739 Hyperglycemia, unspecified: Secondary | ICD-10-CM

## 2016-05-05 DIAGNOSIS — Z729 Problem related to lifestyle, unspecified: Secondary | ICD-10-CM

## 2016-05-05 DIAGNOSIS — E871 Hypo-osmolality and hyponatremia: Secondary | ICD-10-CM

## 2016-05-05 DIAGNOSIS — E785 Hyperlipidemia, unspecified: Secondary | ICD-10-CM

## 2016-05-05 LAB — COMPREHENSIVE METABOLIC PANEL
ALT: 14 U/L (ref 0–35)
AST: 15 U/L (ref 0–37)
Albumin: 4.3 g/dL (ref 3.5–5.2)
Alkaline Phosphatase: 56 U/L (ref 39–117)
BILIRUBIN TOTAL: 0.4 mg/dL (ref 0.2–1.2)
BUN: 13 mg/dL (ref 6–23)
CALCIUM: 9.8 mg/dL (ref 8.4–10.5)
CO2: 28 meq/L (ref 19–32)
CREATININE: 0.67 mg/dL (ref 0.40–1.20)
Chloride: 102 mEq/L (ref 96–112)
GFR: 92.29 mL/min (ref >60.00)
Glucose, Bld: 103 mg/dL — ABNORMAL HIGH (ref 70–99)
Potassium: 4.4 mEq/L (ref 3.5–5.1)
Sodium: 135 mEq/L (ref 135–145)
Total Protein: 6.8 g/dL (ref 6.0–8.3)

## 2016-05-05 LAB — HEMOGLOBIN A1C: Hgb A1c MFr Bld: 6 % (ref 4.6–6.5)

## 2016-05-05 LAB — CBC WITH DIFFERENTIAL/PLATELET
BASOS PCT: 0.5 % (ref 0.0–3.0)
Basophils Absolute: 0 10*3/uL (ref 0.0–0.1)
EOS ABS: 0.1 10*3/uL (ref 0.0–0.7)
EOS PCT: 1 % (ref 0.0–5.0)
HEMATOCRIT: 39.4 % (ref 36.0–46.0)
HEMOGLOBIN: 13.3 g/dL (ref 12.0–15.0)
LYMPHS PCT: 42.5 % (ref 12.0–46.0)
Lymphs Abs: 2.7 10*3/uL (ref 0.7–4.0)
MCHC: 33.7 g/dL (ref 30.0–36.0)
MCV: 90.2 fl (ref 78.0–100.0)
Monocytes Absolute: 0.4 10*3/uL (ref 0.1–1.0)
Monocytes Relative: 7 % (ref 3.0–12.0)
Neutro Abs: 3.1 10*3/uL (ref 1.4–7.7)
Neutrophils Relative %: 49 % (ref 43.0–77.0)
Platelets: 222 10*3/uL (ref 150.0–400.0)
RBC: 4.37 Mil/uL (ref 3.87–5.11)
RDW: 13.8 % (ref 11.5–15.5)
WBC: 6.4 10*3/uL (ref 4.0–10.5)

## 2016-05-05 LAB — LIPID PANEL
CHOL/HDL RATIO: 4
Cholesterol: 235 mg/dL — ABNORMAL HIGH (ref 0–200)
HDL: 53.9 mg/dL (ref >39.00)
LDL CALC: 148 mg/dL — AB (ref 0–99)
NonHDL: 181.36
TRIGLYCERIDES: 169 mg/dL — AB (ref 0.0–149.0)
VLDL: 33.8 mg/dL (ref 0.0–40.0)

## 2016-05-05 LAB — TSH: TSH: 2.93 u[IU]/mL (ref 0.35–4.50)

## 2016-05-05 NOTE — Addendum Note (Signed)
Addended by: Liane ComberHAVERS, Jahn Franchini C on: 05/05/2016 01:10 PM   Modules accepted: Orders

## 2016-05-06 LAB — HEPATITIS C ANTIBODY: HCV Ab: NEGATIVE

## 2016-05-15 ENCOUNTER — Ambulatory Visit (INDEPENDENT_AMBULATORY_CARE_PROVIDER_SITE_OTHER): Payer: Medicare Other

## 2016-05-15 VITALS — BP 122/76 | HR 75 | Temp 97.9°F | Ht 64.0 in | Wt 124.8 lb

## 2016-05-15 DIAGNOSIS — Z0000 Encounter for general adult medical examination without abnormal findings: Secondary | ICD-10-CM

## 2016-05-15 NOTE — Progress Notes (Signed)
Pre visit review using our clinic review tool, if applicable. No additional management support is needed unless otherwise documented below in the visit note. 

## 2016-05-15 NOTE — Progress Notes (Signed)
Subjective:   Carol Chavez is a 70 y.o. female who presents for Medicare Annual (Subsequent) preventive examination.  Review of Systems:  N/A Cardiac Risk Factors include: advanced age (>20men, >77 women);dyslipidemia     Objective:     Vitals: BP 122/76 mmHg  Pulse 75  Temp(Src) 97.9 F (36.6 C) (Oral)  Ht  (1.626 m)  Wt 124 lb 12 oz (56.586 kg)  BMI 21.40 kg/m2  SpO2 97%  Body mass index is 21.4 kg/(m^2).   Tobacco History  Smoking status  . Never Smoker   Smokeless tobacco  . Not on file     Counseling given: No   History reviewed. No pertinent past medical history. History reviewed. No pertinent past surgical history. History reviewed. No pertinent family history. History  Sexual Activity  . Sexual Activity: No    Outpatient Encounter Prescriptions as of 05/15/2016  Medication Sig  . conjugated estrogens (PREMARIN) vaginal cream Place vaginally daily. Use 2 cm of cream intravaginally 2 times per week (Patient not taking: Reported on 05/15/2016)  . fluticasone (FLONASE) 50 MCG/ACT nasal spray Place 2 sprays into the nose daily. As needed (Patient not taking: Reported on 05/15/2016)   No facility-administered encounter medications on file as of 05/15/2016.    Activities of Daily Living In your present state of health, do you have any difficulty performing the following activities: 05/15/2016  Hearing? Y  Vision? N  Difficulty concentrating or making decisions? N  Walking or climbing stairs? N  Dressing or bathing? N  Doing errands, shopping? N  Preparing Food and eating ? N  Using the Toilet? N  Do you have problems with loss of bowel control? N  Managing your Medications? N  Managing your Finances? N  Housekeeping or managing your Housekeeping? N    Patient Care Team: Judy Pimple, MD as PCP - General    Assessment:     Hearing Screening           Right ear:   0 0 0 0   Left ear:   0 0 0 0     Visual  Acuity Screening   Right eye Left eye Both eyes  Without correction:  With correction:     Comments: Last eye exam approx. 4 yrs ago   Exercise Activities and Dietary recommendations Current Exercise Habits: Home exercise routine, Type of exercise: walking, Time (Minutes): 60, Frequency (Times/Week): 7, Weekly Exercise (Minutes/Week): 420, Intensity: Moderate, Exercise limited by: None identified  Goals    . Increase physical activity     Starting 05/15/16, I will continue to walk at least 60 min 7 days per week.       Fall Risk Fall Risk  05/15/2016  Falls in the past year? No   Depression Screen PHQ 2/9 Scores 05/15/2016  PHQ - 2 Score 0     Cognitive Testing MMSE - Mini Mental State Exam 05/15/2016  Orientation to time 5  Orientation to Place 5  Registration 3  Attention/ Calculation 0  Recall 3  Language- name 2 objects 0  Language- repeat 1  Language- follow 3 step command 3  Language- read & follow direction 0  Write a sentence 0  Copy design 0  Total score 20   PLEASE NOTE: A Mini-Cog screen was completed. Maximum score is 20. A value of 0 denotes this part of Folstein MMSE was not completed or the patient failed this part of the Mini-Cog screening.  Mini-Cog Screening Orientation to Time - Max 5 pts Orientation to Place - Max 5 pts Registration - Max 3 pts Recall - Max 3 pts Language Repeat - Max 1 pts Language Follow 3 Step Command - Max 3 pts  Immunization History  Administered Date(s) Administered  . Influenza Whole 10/09/2009  . Influenza, High Dose Seasonal PF 10/08/2015  . Pneumococcal Conjugate-13 10/08/2015  . Pneumococcal Polysaccharide-23 06/13/2013  . Td 12/08/2003  . Tdap 08/09/2012  . Zoster 03/03/2010   Screening Tests Health Maintenance  Topic Date Due  . DEXA SCAN  05/15/2017 (Originally 08/05/2010)  . COLONOSCOPY  06/14/2023 (Originally 08/06/1995)  . INFLUENZA VACCINE  07/07/2016  . MAMMOGRAM  07/17/2016  .  TETANUS/TDAP  08/09/2022  . ZOSTAVAX  Completed  . Hepatitis C Screening  Completed  . PNA vac Low Risk Adult  Completed      Plan:     I have personally reviewed and addressed the Medicare Annual Wellness questionnaire and have noted the following in the patient's chart:  A. Medical and social history B. Use of alcohol, tobacco or illicit drugs  C. Current medications and supplements D. Functional ability and status E.  Nutritional status F.  Physical activity G. Advance directives H. List of other physicians I.  Hospitalizations, surgeries, and ER visits in previous 12 months J.  Vitals K. Screenings to include hearing, vision, cognitive, depression L. Referrals and appointments - none  In addition, I have reviewed and discussed with patient certain preventive protocols, quality metrics, and best practice recommendations. A written personalized care plan for preventive services as well as general preventive health recommendations were provided to patient.  See attached scanned questionnaire for additional information.   Signed,   Randa EvensLesia Anita Mcadory, MHA, BS, LPN Health Advisor

## 2016-05-15 NOTE — Patient Instructions (Signed)
[name] , Thank you for taking time to come for your Medicare Wellness Visit. I appreciate your ongoing commitment to your health goals. Please review the following plan we discussed and let me know if I can assist you in the future.   These are the goals we discussed: Goals    . Increase physical activity     Starting 05/15/16, I will continue to walk at least 60 min 7 days per week.        This is a list of the screening recommended for you and due dates:  Health Maintenance  Topic Date Due  . DEXA scan (bone density measurement)  05/15/2017*  . Colon Cancer Screening  06/14/2023*  . Flu Shot  07/07/2016  . Mammogram  07/17/2016  . Tetanus Vaccine  08/09/2022  . Shingles Vaccine  Completed  .  Hepatitis C: One time screening is recommended by Center for Disease Control  (CDC) for  adults born from [birth year] through 1965.   Completed  . Pneumonia vaccines  Completed  *Topic was postponed. The date shown is not the original due date.    Preventive Care for Adults  A healthy lifestyle and preventive care can promote health and wellness. Preventive health guidelines for adults include the following key practices.  . A routine yearly physical is a good way to check with your health care provider about your health and preventive screening. It is a chance to share any concerns and updates on your health and to receive a thorough exam.  . Visit your dentist for a routine exam and preventive care every 6 months. Brush your teeth twice a day and floss once a day. Good oral hygiene prevents tooth decay and gum disease.  . The frequency of eye exams is based on your age, health, family medical history, use  of contact lenses, and other factors. Follow your health care provider's ecommendations for frequency of eye exams.  . Eat a healthy diet. Foods like vegetables, fruits, whole grains, low-fat dairy products, and lean protein foods contain the nutrients you need without too many calories.  Decrease your intake of foods high in solid fats, added sugars, and salt. Eat the right amount of calories for you. Get information about a proper diet from your health care provider, if necessary.  . Regular physical exercise is one of the most important things you can do for your health. Most adults should get at least 150 minutes of moderate-intensity exercise (any activity that increases your heart rate and causes you to sweat) each week. In addition, most adults need muscle-strengthening exercises on 2 or more days a week.  Silver Sneakers may be a benefit available to you. To determine eligibility, you may visit the website: www.silversneakers.com or contact program at 83211955731-347-616-8324 Mon-Fri between 8AM-8PM.   . Maintain a healthy weight. The body mass index (BMI) is a screening tool to identify possible weight problems. It provides an estimate of body fat based on height and weight. Your health care provider can find your BMI and can help you achieve or maintain a healthy weight.   For adults 20 years and older: ? A BMI below 18.5 is considered underweight. ? A BMI of 18.5 to 24.9 is normal. ? A BMI of 25 to 29.9 is considered overweight. ? A BMI of 30 and above is considered obese.   . Maintain normal blood lipids and cholesterol levels by exercising and minimizing your intake of saturated fat. Eat a balanced diet with plenty  of fruit and vegetables. Blood tests for lipids and cholesterol should begin at age 43 and be repeated every 5 years. If your lipid or cholesterol levels are high, you are over 50, or you are at high risk for heart disease, you may need your cholesterol levels checked more frequently. Ongoing high lipid and cholesterol levels should be treated with medicines if diet and exercise are not working.  . If you smoke, find out from your health care provider how to quit. If you do not use tobacco, please do not start.  . If you choose to drink alcohol, please do not consume  more than 2 drinks per day. One drink is considered to be 12 ounces (355 mL) of beer, 5 ounces (148 mL) of wine, or 1.5 ounces (44 mL) of liquor.  . If you are 32-47 years old, ask your health care provider if you should take aspirin to prevent strokes.  . Use sunscreen. Apply sunscreen liberally and repeatedly throughout the day. You should seek shade when your shadow is shorter than you. Protect yourself by wearing long sleeves, pants, a wide-brimmed hat, and sunglasses year round, whenever you are outdoors.  . Once a month, do a whole body skin exam, using a mirror to look at the skin on your back. Tell your health care provider of new moles, moles that have irregular borders, moles that are larger than a pencil eraser, or moles that have changed in shape or color.

## 2016-05-15 NOTE — Progress Notes (Signed)
PCP notes:  Health maintenance:  Bone density - postponed/pt will discuss with PCP at next appt Colon cancer screening - pt had declined colonoscopy; education provided to pt about other options such as Cologuard and FOBT/pt will discuss these options with PCP at next appt  Abnormal screenings:  Hearing - failed  Patient concerns: None  Nurse concerns: None  Next PCP appt: 06/30/16 @ 0800   I reviewed health advisor's note, was available for consultation, and agree with documentation and plan.

## 2016-05-19 ENCOUNTER — Ambulatory Visit: Payer: Medicare Other | Admitting: Family Medicine

## 2016-06-30 ENCOUNTER — Ambulatory Visit: Payer: Medicare Other | Admitting: Family Medicine

## 2016-07-27 ENCOUNTER — Ambulatory Visit (INDEPENDENT_AMBULATORY_CARE_PROVIDER_SITE_OTHER): Payer: Medicare Other | Admitting: Family Medicine

## 2016-07-27 ENCOUNTER — Encounter: Payer: Self-pay | Admitting: Family Medicine

## 2016-07-27 VITALS — BP 114/70 | HR 72 | Temp 98.4°F | Ht 64.25 in | Wt 123.8 lb

## 2016-07-27 DIAGNOSIS — E785 Hyperlipidemia, unspecified: Secondary | ICD-10-CM | POA: Diagnosis not present

## 2016-07-27 DIAGNOSIS — R739 Hyperglycemia, unspecified: Secondary | ICD-10-CM

## 2016-07-27 DIAGNOSIS — Z0000 Encounter for general adult medical examination without abnormal findings: Secondary | ICD-10-CM

## 2016-07-27 DIAGNOSIS — Z1211 Encounter for screening for malignant neoplasm of colon: Secondary | ICD-10-CM

## 2016-07-27 MED ORDER — ESTRADIOL 0.1 MG/GM VA CREA: TOPICAL_CREAM | VAGINAL | 5 refills | Status: DC

## 2016-07-27 NOTE — Patient Instructions (Addendum)
Don't forget to schedule [facility] at [facility]  Take care of yourself  Continue calcium and D  Here is a px for estrace cream  Keep walking

## 2016-07-27 NOTE — Assessment & Plan Note (Signed)
Reviewed health habits including diet and exercise and skin cancer prevention Reviewed appropriate screening tests for age  Also reviewed health mt list, fam hx and immunization status , as well as social and family history   See HPI Labs reviewed [facility] reviewed Don't forget to [facility]  Take care of yourself  Continue calcium and D  Here is a px for estrace cream  Keep walking

## 2016-07-27 NOTE — Assessment & Plan Note (Signed)
Disc goals for lipids and reasons to control them Rev labs with pt Rev low sat fat diet in detail slt improvement  Declines treatment  Will keep working on diet No longer taking red yeast rice

## 2016-07-27 NOTE — Progress Notes (Signed)
Pre visit review using our clinic review tool, if applicable. No additional management support is needed unless otherwise documented below in the visit note. 

## 2016-07-27 NOTE — Assessment & Plan Note (Signed)
Lab Results  Component Value Date   HGBA1C 6.0 05/05/2016   Disc imp of wt control and low glycemic diet to prevent DM2

## 2016-07-27 NOTE — Assessment & Plan Note (Signed)
Declines a colonoscopy Given info on cologuard to look at -she may consider it Will call if she wishes to order No symptoms

## 2016-07-27 NOTE — Progress Notes (Signed)
Subjective:    Patient ID: Carol SledgeMARSHA J Holben, female    DOB: 07/12/1946, 70 y.o.   MRN: [id number]  HPI Here for f/u of chronic medical problems   Has been doing well overall   Saw Lesia for her AMW visit in June  Did fail hearing exam She notices some decrease in hearing from the past  Not really ready for hearing eval  Or hearing aides yet   Declined colonoscopy  May be interested in cologuard -will read the info and let us know   Put off dexa-not interested in one  No falls or fractures  Takes her calcium and vitamin D  She walks for exercise every day   Mammogram was 8/16- has not scheduled it yet-she will do that on line - goes to [facility]  Self exam- no lumps or changes   Wt Readings from Last 3 Encounters:  07/27/16 123 lb 12 oz (56.1 kg)  05/15/16 124 lb 12 oz (56.6 kg)  06/13/13 126 lb 12 oz (57.5 kg)  walks daily  Eats well  bmi is 21  Hx of hyperglycemia Lab Results  Component Value Date   HGBA1C 6.0 05/05/2016  watching this  Watches her sugar and carbs  MGM had DM later in life  She stays slim and walks    The patient has a history of of hyperlipidemia Lab Results  Component Value Date   CHOL 235 (H) 05/05/2016   HDL 53.90 05/05/2016   LDLCALC 148 (H) 05/05/2016   LDLDIRECT 165.9 06/06/2013   TRIG 169.0 (H) 05/05/2016   CHOLHDL 4 05/05/2016    Improved a bit - inc HDL and dec LDL  Watching diet  Declines medication  She had stopped red yeast rice - and then took coconut oil   She has vaginal dryness- atrophy  Was prev on estrogen cream  Wants to try it again   Patient Active Problem List   Diagnosis Date Noted  . Routine general medical examination at a health care facility 07/27/2016  . Hyponatremia 04/26/2016  . Hyperglycemia 04/26/2016  . Encounter for Medicare annual wellness exam 06/13/2013  . Colon cancer screening 06/13/2013  . RHINITIS 03/04/2010  . Hyperlipidemia 07/25/2009  . VAGINITIS, ATROPHIC, POSTMENOPAUSAL 07/25/2009   No  past medical history on file. No past surgical history on file. Social History  Substance Use Topics  . Smoking status: Never Smoker  . Smokeless tobacco: Never Used  . Alcohol use Yes     Comment: occ   No family history on file. No Known Allergies No current outpatient prescriptions on file prior to visit.   No current facility-administered medications on file prior to visit.     Review of Systems    Review of Systems  Constitutional: Negative for fever, appetite change, fatigue and unexpected weight change.  Eyes: Negative for pain and visual disturbance.  Respiratory: Negative for cough and shortness of breath.   Cardiovascular: Negative for cp or palpitations    Gastrointestinal: Negative for nausea, diarrhea and constipation.  Genitourinary: Negative for urgency and frequency.  Skin: Negative for pallor or rash   Neurological: Negative for weakness, light-headedness, numbness and headaches.  Hematological: Negative for adenopathy. Does not bruise/bleed easily.  Psychiatric/Behavioral: Negative for dysphoric mood. The patient is not nervous/anxious.      Objective:   Physical Exam  Constitutional: She appears well-developed and well-nourished. No distress.  Well appearing   HENT:  Head: Normocephalic and atraumatic.  Right Ear: External ear normal.  Left  Ear: External ear normal.  Mouth/Throat: Oropharynx is clear and moist.  Eyes: Conjunctivae and EOM are normal. Pupils are equal, round, and reactive to light. No scleral icterus.  Neck: Normal range of motion. Neck supple. No JVD present. Carotid bruit is not present. No thyromegaly present.  Cardiovascular: Normal rate, regular rhythm, normal heart sounds and intact distal pulses.  Exam reveals no gallop.   Pulmonary/Chest: Effort normal and breath sounds normal. No respiratory distress. She has no wheezes. She exhibits no tenderness.  Abdominal: Soft. Bowel sounds are normal. She exhibits no distension, no abdominal  bruit and no mass. There is no tenderness.  Genitourinary: No breast swelling, tenderness, discharge or bleeding.  Genitourinary Comments: Breast implants noted bilaterally  Breast exam: No mass, nodules, thickening, tenderness, bulging, retraction, inflamation, nipple discharge or skin changes noted.  No axillary or clavicular LA.      Musculoskeletal: Normal range of motion. She exhibits no edema or tenderness.  Lymphadenopathy:    She has no cervical adenopathy.  Neurological: She is alert. She has normal reflexes. No cranial nerve deficit. She exhibits normal muscle tone. Coordination normal.  Skin: Skin is warm and dry. No rash noted. No erythema. No pallor.  SKs and angiomas diffusely  Psychiatric: She has a normal mood and affect.          Assessment & Plan:   Problem List Items Addressed This Visit      Other   Routine general medical examination at a health care facility    Reviewed health habits including diet and exercise and skin cancer prevention Reviewed appropriate screening tests for age  Also reviewed health mt list, fam hx and immunization status , as well as social and family history   See HPI Labs reviewed AMW reviewed Don't forget to schedule your mammogram at [facility]  Take care of yourself  Continue calcium and D  Here is a px for estrace cream  Keep walking       Hyperlipidemia - Primary    Disc goals for lipids and reasons to control them Rev labs with pt Rev low sat fat diet in detail slt improvement  Declines treatment  Will keep working on diet No longer taking red yeast rice       Hyperglycemia    Lab Results  Component Value Date   HGBA1C 6.0 05/05/2016   Disc imp of wt control and low glycemic diet to prevent DM2      Colon cancer screening    Declines a colonoscopy Given info on cologuard to look at -she may consider it Will call if she wishes to order No symptoms       Other Visit Diagnoses   None.

## 2016-09-14 ENCOUNTER — Encounter: Payer: Self-pay | Admitting: Family Medicine

## 2016-09-14 DIAGNOSIS — Z1231 Encounter for screening mammogram for malignant neoplasm of breast: Secondary | ICD-10-CM | POA: Diagnosis not present

## 2016-10-02 DIAGNOSIS — Z23 Encounter for immunization: Secondary | ICD-10-CM | POA: Diagnosis not present

## 2017-03-26 DIAGNOSIS — H5212 Myopia, left eye: Secondary | ICD-10-CM | POA: Diagnosis not present

## 2017-04-16 DIAGNOSIS — H26491 Other secondary cataract, right eye: Secondary | ICD-10-CM | POA: Diagnosis not present

## 2017-04-16 DIAGNOSIS — Z961 Presence of intraocular lens: Secondary | ICD-10-CM | POA: Diagnosis not present

## 2017-04-16 DIAGNOSIS — H40013 Open angle with borderline findings, low risk, bilateral: Secondary | ICD-10-CM | POA: Diagnosis not present

## 2017-04-20 DIAGNOSIS — Z961 Presence of intraocular lens: Secondary | ICD-10-CM | POA: Diagnosis not present

## 2017-04-20 DIAGNOSIS — H40013 Open angle with borderline findings, low risk, bilateral: Secondary | ICD-10-CM | POA: Diagnosis not present

## 2017-04-20 DIAGNOSIS — H26491 Other secondary cataract, right eye: Secondary | ICD-10-CM | POA: Diagnosis not present

## 2017-07-18 DIAGNOSIS — M79672 Pain in left foot: Secondary | ICD-10-CM | POA: Diagnosis not present

## 2017-07-19 DIAGNOSIS — S92345A Nondisplaced fracture of fourth metatarsal bone, left foot, initial encounter for closed fracture: Secondary | ICD-10-CM | POA: Diagnosis not present

## 2017-08-11 DIAGNOSIS — S92345D Nondisplaced fracture of fourth metatarsal bone, left foot, subsequent encounter for fracture with routine healing: Secondary | ICD-10-CM | POA: Diagnosis not present

## 2017-09-08 DIAGNOSIS — S92345D Nondisplaced fracture of fourth metatarsal bone, left foot, subsequent encounter for fracture with routine healing: Secondary | ICD-10-CM | POA: Diagnosis not present

## 2017-10-06 DIAGNOSIS — S92345D Nondisplaced fracture of fourth metatarsal bone, left foot, subsequent encounter for fracture with routine healing: Secondary | ICD-10-CM | POA: Diagnosis not present

## 2017-12-13 DIAGNOSIS — M79672 Pain in left foot: Secondary | ICD-10-CM | POA: Diagnosis not present

## 2017-12-17 DIAGNOSIS — M79672 Pain in left foot: Secondary | ICD-10-CM | POA: Diagnosis not present

## 2017-12-21 DIAGNOSIS — M79672 Pain in left foot: Secondary | ICD-10-CM | POA: Diagnosis not present

## 2017-12-24 DIAGNOSIS — M79672 Pain in left foot: Secondary | ICD-10-CM | POA: Diagnosis not present

## 2017-12-27 DIAGNOSIS — Z1231 Encounter for screening mammogram for malignant neoplasm of breast: Secondary | ICD-10-CM | POA: Diagnosis not present

## 2018-03-07 ENCOUNTER — Ambulatory Visit (INDEPENDENT_AMBULATORY_CARE_PROVIDER_SITE_OTHER): Payer: Medicare Other | Admitting: Family Medicine

## 2018-03-07 ENCOUNTER — Encounter: Payer: Self-pay | Admitting: Family Medicine

## 2018-03-07 VITALS — BP 128/78 | HR 73 | Temp 97.5°F | Ht 64.0 in | Wt 127.5 lb

## 2018-03-07 DIAGNOSIS — Z0000 Encounter for general adult medical examination without abnormal findings: Secondary | ICD-10-CM

## 2018-03-07 DIAGNOSIS — N952 Postmenopausal atrophic vaginitis: Secondary | ICD-10-CM | POA: Diagnosis not present

## 2018-03-07 DIAGNOSIS — Z0001 Encounter for general adult medical examination with abnormal findings: Secondary | ICD-10-CM | POA: Diagnosis not present

## 2018-03-07 DIAGNOSIS — E7800 Pure hypercholesterolemia, unspecified: Secondary | ICD-10-CM

## 2018-03-07 DIAGNOSIS — R739 Hyperglycemia, unspecified: Secondary | ICD-10-CM

## 2018-03-07 DIAGNOSIS — Z1211 Encounter for screening for malignant neoplasm of colon: Secondary | ICD-10-CM

## 2018-03-07 LAB — LIPID PANEL
CHOL/HDL RATIO: 4
CHOLESTEROL: 188 mg/dL (ref 0–200)
HDL: 50 mg/dL (ref >39.00)
LDL Cholesterol: 111 mg/dL — ABNORMAL HIGH (ref 0–99)
NonHDL: 137.83
TRIGLYCERIDES: 135 mg/dL (ref 0.0–149.0)
VLDL: 27 mg/dL (ref 0.0–40.0)

## 2018-03-07 LAB — CBC WITH DIFFERENTIAL/PLATELET
Basophils Absolute: 0 10*3/uL (ref 0.0–0.1)
Basophils Relative: 0.5 % (ref 0.0–3.0)
Eosinophils Absolute: 0.1 10*3/uL (ref 0.0–0.7)
Eosinophils Relative: 1.1 % (ref 0.0–5.0)
HEMATOCRIT: 40.8 % (ref 36.0–46.0)
Hemoglobin: 13.8 g/dL (ref 12.0–15.0)
LYMPHS PCT: 30.4 % (ref 12.0–46.0)
Lymphs Abs: 2.3 10*3/uL (ref 0.7–4.0)
MCHC: 33.9 g/dL (ref 30.0–36.0)
MCV: 91.7 fl (ref 78.0–100.0)
MONOS PCT: 7.5 % (ref 3.0–12.0)
Monocytes Absolute: 0.6 10*3/uL (ref 0.1–1.0)
NEUTROS ABS: 4.6 10*3/uL (ref 1.4–7.7)
Neutrophils Relative %: 60.5 % (ref 43.0–77.0)
PLATELETS: 236 10*3/uL (ref 150.0–400.0)
RBC: 4.45 Mil/uL (ref 3.87–5.11)
RDW: 13.5 % (ref 11.5–15.5)
WBC: 7.6 10*3/uL (ref 4.0–10.5)

## 2018-03-07 LAB — COMPREHENSIVE METABOLIC PANEL
ALT: 18 U/L (ref 0–35)
AST: 18 U/L (ref 0–37)
Albumin: 4.1 g/dL (ref 3.5–5.2)
Alkaline Phosphatase: 54 U/L (ref 39–117)
BILIRUBIN TOTAL: 0.4 mg/dL (ref 0.2–1.2)
BUN: 19 mg/dL (ref 6–23)
CALCIUM: 9.5 mg/dL (ref 8.4–10.5)
CO2: 28 meq/L (ref 19–32)
CREATININE: 0.61 mg/dL (ref 0.40–1.20)
Chloride: 100 mEq/L (ref 96–112)
GFR: 102.3 mL/min (ref >60.00)
Glucose, Bld: 119 mg/dL — ABNORMAL HIGH (ref 70–99)
Potassium: 4.3 mEq/L (ref 3.5–5.1)
Sodium: 135 mEq/L (ref 135–145)
Total Protein: 7.1 g/dL (ref 6.0–8.3)

## 2018-03-07 LAB — TSH: TSH: 2.62 u[IU]/mL (ref 0.35–4.50)

## 2018-03-07 LAB — HEMOGLOBIN A1C: HEMOGLOBIN A1C: 5.9 % (ref 4.6–6.5)

## 2018-03-07 MED ORDER — ESTRADIOL 0.1 MG/GM VA CREA: TOPICAL_CREAM | VAGINAL | 5 refills | Status: DC

## 2018-03-07 NOTE — Assessment & Plan Note (Signed)
A1C today  disc imp of low glycemic diet and wt loss to prevent DM2  

## 2018-03-07 NOTE — Assessment & Plan Note (Signed)
Pt plans to try estrogen vaginal cream -px printed

## 2018-03-07 NOTE — Progress Notes (Signed)
Subjective:    Patient ID: Carol Chavez, female    DOB: May 15, 1946, 72 y.o.   MRN: [id number]  HPI Here for health maintenance exam and to review chronic medical problems    Broke her 4th metatarsal L foot (working in yard with arms full-on steps) - august mid Saw Dr Farris Has at [employer] office - cast/knee scooter/boot  Aggravated by how long it is taking to heal  No longer wearing a boot  It is stiff after inactivity   Got hearing aides a year ago   Is now back to walking the dog   Feels good overall   Did not have amw visit  Wt Readings from Last 3 Encounters:  03/07/18 127 lb 8 oz (57.8 kg)  07/27/16 123 lb 12 oz (56.1 kg)  05/15/16 124 lb 12 oz (56.6 kg)  good weight  Eating healthy  21.89 kg/m   Has declined colonoscopy  Was given info on cologuard in the past -would like to get signed up for that  ifob neg 4/11   utd imms Had zostavax 3/11 She rec the Shingrix in January- will get the 2nd one in time required    Bone density screening -declines dexa  Does not want to know - because she would "never" take any therapy for OP  Takes 4000 iu of d daily  Also calcium  Also walking/ weight bearing exercise  Takes magnesium also for leg cramps and it helps    Mammogram 1/19  Negative  Self breast exam - no breast lumps   Hyperglycemia Lab Results  Component Value Date   HGBA1C 6.0 05/05/2016  due for labs  Does not eat a lot of sweets (some)  More carbs - bread/pasta   MGM had DM in old age   H/o hyperlipidemia  Lab Results  Component Value Date   CHOL 235 (H) 05/05/2016   HDL 53.90 05/05/2016   LDLCALC 148 (H) 05/05/2016   LDLDIRECT 165.9 06/06/2013   TRIG 169.0 (H) 05/05/2016   CHOLHDL 4 05/05/2016  due for labs  She stays away from fried foods and red meat    BP BP Readings from Last 3 Encounters:  03/07/18 128/78  07/27/16 114/70  05/15/16 122/76   Was px estrogen cream for atrophic vag changes of menopause She did not end up  using it but now wants to try it    Patient Active Problem List   Diagnosis Date Noted  . Routine general medical examination at a health care facility 07/27/2016  . Hyponatremia 04/26/2016  . Hyperglycemia 04/26/2016  . Encounter for Medicare annual wellness exam 06/13/2013  . Colon cancer screening 06/13/2013  . RHINITIS 03/04/2010  . Hyperlipidemia 07/25/2009  . VAGINITIS, ATROPHIC, POSTMENOPAUSAL 07/25/2009   History reviewed. No pertinent past medical history. History reviewed. No pertinent surgical history. Social History   Tobacco Use  . Smoking status: Never Smoker  . Smokeless tobacco: Never Used  Substance Use Topics  . Alcohol use: Yes    Comment: occ  . Drug use: No   History reviewed. No pertinent family history. No Known Allergies Current Outpatient Medications on File Prior to Visit  Medication Sig Dispense Refill  . CALCIUM PO Take 1 capsule by mouth at bedtime.    . Cholecalciferol (VITAMIN D PO) Take 1 capsule by mouth daily.     . LUTEIN PO Take 1 capsule by mouth daily.     . Multiple Vitamin (MULTIVITAMIN) capsule Take 1 capsule by mouth daily.  No current facility-administered medications on file prior to visit.     Review of Systems  Constitutional: Negative for activity change, appetite change, fatigue, fever and unexpected weight change.  HENT: Negative for congestion, ear pain, rhinorrhea, sinus pressure and sore throat.   Eyes: Negative for pain, redness and visual disturbance.  Respiratory: Negative for cough, shortness of breath and wheezing.   Cardiovascular: Negative for chest pain and palpitations.  Gastrointestinal: Negative for abdominal pain, blood in stool, constipation and diarrhea.  Endocrine: Negative for polydipsia and polyuria.  Genitourinary: Negative for dysuria, frequency and urgency.  Musculoskeletal: Negative for arthralgias, back pain and myalgias.  Skin: Negative for pallor and rash.  Allergic/Immunologic: Negative for  environmental allergies.  Neurological: Negative for dizziness, syncope and headaches.  Hematological: Negative for adenopathy. Does not bruise/bleed easily.  Psychiatric/Behavioral: Negative for decreased concentration and dysphoric mood. The patient is not nervous/anxious.        Objective:   Physical Exam  Constitutional: She appears well-developed and well-nourished. No distress.  Slim and well app  HENT:  Head: Normocephalic and atraumatic.  Right Ear: External ear normal.  Left Ear: External ear normal.  Mouth/Throat: Oropharynx is clear and moist.  Eyes: Pupils are equal, round, and reactive to light. Conjunctivae and EOM are normal. No scleral icterus.  Neck: Normal range of motion. Neck supple. No JVD present. Carotid bruit is not present. No thyromegaly present.  Cardiovascular: Normal rate, regular rhythm, normal heart sounds and intact distal pulses. Exam reveals no gallop.  Pulmonary/Chest: Effort normal and breath sounds normal. No respiratory distress. She has no wheezes. She exhibits no tenderness. No breast swelling, tenderness, discharge or bleeding.  Abdominal: Soft. Bowel sounds are normal. She exhibits no distension, no abdominal bruit and no mass. There is no tenderness.  Genitourinary: No breast swelling, tenderness, discharge or bleeding.  Genitourinary Comments: Breast exam: No mass, nodules, thickening, tenderness, bulging, retraction, inflamation, nipple discharge or skin changes noted.  No axillary or clavicular LA.      Musculoskeletal: Normal range of motion. She exhibits no edema or tenderness.  Lymphadenopathy:    She has no cervical adenopathy.  Neurological: She is alert. She has normal reflexes. No cranial nerve deficit. She exhibits normal muscle tone. Coordination normal.  Skin: Skin is warm and dry. No rash noted. No erythema. No pallor.  Psychiatric: She has a normal mood and affect.          Assessment & Plan:   Problem List Items Addressed  This Visit      Genitourinary   VAGINITIS, ATROPHIC, POSTMENOPAUSAL    Pt plans to try estrogen vaginal cream -px printed          Other   Colon cancer screening    Signed up for cologuard program  Declines colonoscopy      Hyperglycemia    A1C today  disc imp of low glycemic diet and wt loss to prevent DM2        Relevant Orders   Hemoglobin A1c (Completed)   Hyperlipidemia    Labs today for lipids Has declined treatment in the past Disc goals for lipids and reasons to control them Rev labs with pt (from last check) Rev low sat fat diet in detail       Relevant Orders   Comprehensive metabolic panel (Completed)   Lipid panel (Completed)   Routine general medical examination at a health care facility - Primary    Reviewed health habits including diet and exercise and skin  cancer prevention Reviewed appropriate screening tests for age  Also reviewed health mt list, fam hx and immunization status , as well as social and family history   See HPI Labs ordered Declines dexa Signed up for cologuard for colon screening  Plans to get 2nd shingrix vaccine  Disc health habits for DM prevention        Relevant Orders   CBC with Differential/Platelet (Completed)   Comprehensive metabolic panel (Completed)   Lipid panel (Completed)   TSH (Completed)

## 2018-03-07 NOTE — Assessment & Plan Note (Signed)
Labs today for lipids Has declined treatment in the past Disc goals for lipids and reasons to control them Rev labs with pt (from last check) Rev low sat fat diet in detail

## 2018-03-07 NOTE — Patient Instructions (Addendum)
Call your pharmacy - and see about the status of the 2nd shingrix vaccine (get on waiting list if you need to )   To prevent diabetes Try to get most of your carbohydrates from produce (with the exception of white potatoes)  Eat less bread/pasta/rice/snack foods/cereals/sweets and other items from the middle of the grocery store (processed carbs)  For cholesterol Avoid red meat/ fried foods/ egg yolks/ fatty breakfast meats/ butter, cheese and high fat dairy/ and shellfish

## 2018-03-07 NOTE — Assessment & Plan Note (Signed)
Reviewed health habits including diet and exercise and skin cancer prevention Reviewed appropriate screening tests for age  Also reviewed health mt list, fam hx and immunization status , as well as social and family history   See HPI Labs ordered Declines dexa Signed up for cologuard for colon screening  Plans to get 2nd shingrix vaccine  Disc health habits for DM prevention

## 2018-03-07 NOTE — Assessment & Plan Note (Signed)
Signed up for cologuard program  Declines colonoscopy

## 2018-03-29 DIAGNOSIS — H5213 Myopia, bilateral: Secondary | ICD-10-CM | POA: Diagnosis not present

## 2018-05-03 DIAGNOSIS — M9903 Segmental and somatic dysfunction of lumbar region: Secondary | ICD-10-CM | POA: Diagnosis not present

## 2018-05-03 DIAGNOSIS — M9905 Segmental and somatic dysfunction of pelvic region: Secondary | ICD-10-CM | POA: Diagnosis not present

## 2018-05-03 DIAGNOSIS — M9904 Segmental and somatic dysfunction of sacral region: Secondary | ICD-10-CM | POA: Diagnosis not present

## 2018-05-03 DIAGNOSIS — M5136 Other intervertebral disc degeneration, lumbar region: Secondary | ICD-10-CM | POA: Diagnosis not present

## 2018-05-05 DIAGNOSIS — M9903 Segmental and somatic dysfunction of lumbar region: Secondary | ICD-10-CM | POA: Diagnosis not present

## 2018-05-05 DIAGNOSIS — M9905 Segmental and somatic dysfunction of pelvic region: Secondary | ICD-10-CM | POA: Diagnosis not present

## 2018-05-05 DIAGNOSIS — M5136 Other intervertebral disc degeneration, lumbar region: Secondary | ICD-10-CM | POA: Diagnosis not present

## 2018-05-05 DIAGNOSIS — M9904 Segmental and somatic dysfunction of sacral region: Secondary | ICD-10-CM | POA: Diagnosis not present

## 2018-05-09 DIAGNOSIS — M9903 Segmental and somatic dysfunction of lumbar region: Secondary | ICD-10-CM | POA: Diagnosis not present

## 2018-05-09 DIAGNOSIS — M5136 Other intervertebral disc degeneration, lumbar region: Secondary | ICD-10-CM | POA: Diagnosis not present

## 2018-05-09 DIAGNOSIS — M9905 Segmental and somatic dysfunction of pelvic region: Secondary | ICD-10-CM | POA: Diagnosis not present

## 2018-05-09 DIAGNOSIS — M9904 Segmental and somatic dysfunction of sacral region: Secondary | ICD-10-CM | POA: Diagnosis not present

## 2018-05-11 DIAGNOSIS — M9905 Segmental and somatic dysfunction of pelvic region: Secondary | ICD-10-CM | POA: Diagnosis not present

## 2018-05-11 DIAGNOSIS — M5136 Other intervertebral disc degeneration, lumbar region: Secondary | ICD-10-CM | POA: Diagnosis not present

## 2018-05-11 DIAGNOSIS — M9903 Segmental and somatic dysfunction of lumbar region: Secondary | ICD-10-CM | POA: Diagnosis not present

## 2018-05-11 DIAGNOSIS — M9904 Segmental and somatic dysfunction of sacral region: Secondary | ICD-10-CM | POA: Diagnosis not present

## 2018-05-16 DIAGNOSIS — M9903 Segmental and somatic dysfunction of lumbar region: Secondary | ICD-10-CM | POA: Diagnosis not present

## 2018-05-16 DIAGNOSIS — M9905 Segmental and somatic dysfunction of pelvic region: Secondary | ICD-10-CM | POA: Diagnosis not present

## 2018-05-16 DIAGNOSIS — M5136 Other intervertebral disc degeneration, lumbar region: Secondary | ICD-10-CM | POA: Diagnosis not present

## 2018-05-16 DIAGNOSIS — M9904 Segmental and somatic dysfunction of sacral region: Secondary | ICD-10-CM | POA: Diagnosis not present

## 2018-05-18 DIAGNOSIS — M9905 Segmental and somatic dysfunction of pelvic region: Secondary | ICD-10-CM | POA: Diagnosis not present

## 2018-05-18 DIAGNOSIS — M9903 Segmental and somatic dysfunction of lumbar region: Secondary | ICD-10-CM | POA: Diagnosis not present

## 2018-05-18 DIAGNOSIS — M9904 Segmental and somatic dysfunction of sacral region: Secondary | ICD-10-CM | POA: Diagnosis not present

## 2018-05-18 DIAGNOSIS — M5136 Other intervertebral disc degeneration, lumbar region: Secondary | ICD-10-CM | POA: Diagnosis not present

## 2018-05-19 DIAGNOSIS — M9905 Segmental and somatic dysfunction of pelvic region: Secondary | ICD-10-CM | POA: Diagnosis not present

## 2018-05-19 DIAGNOSIS — M9904 Segmental and somatic dysfunction of sacral region: Secondary | ICD-10-CM | POA: Diagnosis not present

## 2018-05-19 DIAGNOSIS — M9903 Segmental and somatic dysfunction of lumbar region: Secondary | ICD-10-CM | POA: Diagnosis not present

## 2018-05-19 DIAGNOSIS — M5136 Other intervertebral disc degeneration, lumbar region: Secondary | ICD-10-CM | POA: Diagnosis not present

## 2018-05-25 DIAGNOSIS — M9903 Segmental and somatic dysfunction of lumbar region: Secondary | ICD-10-CM | POA: Diagnosis not present

## 2018-05-25 DIAGNOSIS — M5136 Other intervertebral disc degeneration, lumbar region: Secondary | ICD-10-CM | POA: Diagnosis not present

## 2018-05-25 DIAGNOSIS — M9904 Segmental and somatic dysfunction of sacral region: Secondary | ICD-10-CM | POA: Diagnosis not present

## 2018-05-25 DIAGNOSIS — M9905 Segmental and somatic dysfunction of pelvic region: Secondary | ICD-10-CM | POA: Diagnosis not present

## 2018-06-01 DIAGNOSIS — M9905 Segmental and somatic dysfunction of pelvic region: Secondary | ICD-10-CM | POA: Diagnosis not present

## 2018-06-01 DIAGNOSIS — M9904 Segmental and somatic dysfunction of sacral region: Secondary | ICD-10-CM | POA: Diagnosis not present

## 2018-06-01 DIAGNOSIS — M5136 Other intervertebral disc degeneration, lumbar region: Secondary | ICD-10-CM | POA: Diagnosis not present

## 2018-06-01 DIAGNOSIS — M9903 Segmental and somatic dysfunction of lumbar region: Secondary | ICD-10-CM | POA: Diagnosis not present

## 2018-06-07 DIAGNOSIS — M9903 Segmental and somatic dysfunction of lumbar region: Secondary | ICD-10-CM | POA: Diagnosis not present

## 2018-06-07 DIAGNOSIS — M5136 Other intervertebral disc degeneration, lumbar region: Secondary | ICD-10-CM | POA: Diagnosis not present

## 2018-06-07 DIAGNOSIS — M9905 Segmental and somatic dysfunction of pelvic region: Secondary | ICD-10-CM | POA: Diagnosis not present

## 2018-06-07 DIAGNOSIS — M9904 Segmental and somatic dysfunction of sacral region: Secondary | ICD-10-CM | POA: Diagnosis not present

## 2018-06-16 DIAGNOSIS — M5136 Other intervertebral disc degeneration, lumbar region: Secondary | ICD-10-CM | POA: Diagnosis not present

## 2018-06-16 DIAGNOSIS — M9904 Segmental and somatic dysfunction of sacral region: Secondary | ICD-10-CM | POA: Diagnosis not present

## 2018-06-16 DIAGNOSIS — M9905 Segmental and somatic dysfunction of pelvic region: Secondary | ICD-10-CM | POA: Diagnosis not present

## 2018-06-16 DIAGNOSIS — M9903 Segmental and somatic dysfunction of lumbar region: Secondary | ICD-10-CM | POA: Diagnosis not present

## 2018-06-29 DIAGNOSIS — M9904 Segmental and somatic dysfunction of sacral region: Secondary | ICD-10-CM | POA: Diagnosis not present

## 2018-06-29 DIAGNOSIS — M9903 Segmental and somatic dysfunction of lumbar region: Secondary | ICD-10-CM | POA: Diagnosis not present

## 2018-06-29 DIAGNOSIS — M9905 Segmental and somatic dysfunction of pelvic region: Secondary | ICD-10-CM | POA: Diagnosis not present

## 2018-06-29 DIAGNOSIS — M5136 Other intervertebral disc degeneration, lumbar region: Secondary | ICD-10-CM | POA: Diagnosis not present

## 2018-07-13 DIAGNOSIS — M9904 Segmental and somatic dysfunction of sacral region: Secondary | ICD-10-CM | POA: Diagnosis not present

## 2018-07-13 DIAGNOSIS — M9905 Segmental and somatic dysfunction of pelvic region: Secondary | ICD-10-CM | POA: Diagnosis not present

## 2018-07-13 DIAGNOSIS — M5136 Other intervertebral disc degeneration, lumbar region: Secondary | ICD-10-CM | POA: Diagnosis not present

## 2018-07-13 DIAGNOSIS — M9903 Segmental and somatic dysfunction of lumbar region: Secondary | ICD-10-CM | POA: Diagnosis not present

## 2018-07-20 DIAGNOSIS — M9903 Segmental and somatic dysfunction of lumbar region: Secondary | ICD-10-CM | POA: Diagnosis not present

## 2018-07-20 DIAGNOSIS — M5136 Other intervertebral disc degeneration, lumbar region: Secondary | ICD-10-CM | POA: Diagnosis not present

## 2018-07-20 DIAGNOSIS — M9904 Segmental and somatic dysfunction of sacral region: Secondary | ICD-10-CM | POA: Diagnosis not present

## 2018-07-20 DIAGNOSIS — M9905 Segmental and somatic dysfunction of pelvic region: Secondary | ICD-10-CM | POA: Diagnosis not present

## 2018-07-27 DIAGNOSIS — M9903 Segmental and somatic dysfunction of lumbar region: Secondary | ICD-10-CM | POA: Diagnosis not present

## 2018-07-27 DIAGNOSIS — M9904 Segmental and somatic dysfunction of sacral region: Secondary | ICD-10-CM | POA: Diagnosis not present

## 2018-07-27 DIAGNOSIS — M5136 Other intervertebral disc degeneration, lumbar region: Secondary | ICD-10-CM | POA: Diagnosis not present

## 2018-07-27 DIAGNOSIS — M9905 Segmental and somatic dysfunction of pelvic region: Secondary | ICD-10-CM | POA: Diagnosis not present

## 2018-08-04 DIAGNOSIS — M9905 Segmental and somatic dysfunction of pelvic region: Secondary | ICD-10-CM | POA: Diagnosis not present

## 2018-08-04 DIAGNOSIS — M9903 Segmental and somatic dysfunction of lumbar region: Secondary | ICD-10-CM | POA: Diagnosis not present

## 2018-08-04 DIAGNOSIS — M5136 Other intervertebral disc degeneration, lumbar region: Secondary | ICD-10-CM | POA: Diagnosis not present

## 2018-08-04 DIAGNOSIS — M9904 Segmental and somatic dysfunction of sacral region: Secondary | ICD-10-CM | POA: Diagnosis not present

## 2018-08-10 DIAGNOSIS — M9905 Segmental and somatic dysfunction of pelvic region: Secondary | ICD-10-CM | POA: Diagnosis not present

## 2018-08-10 DIAGNOSIS — M5136 Other intervertebral disc degeneration, lumbar region: Secondary | ICD-10-CM | POA: Diagnosis not present

## 2018-08-10 DIAGNOSIS — M9904 Segmental and somatic dysfunction of sacral region: Secondary | ICD-10-CM | POA: Diagnosis not present

## 2018-08-10 DIAGNOSIS — M9903 Segmental and somatic dysfunction of lumbar region: Secondary | ICD-10-CM | POA: Diagnosis not present

## 2018-08-11 DIAGNOSIS — Z0000 Encounter for general adult medical examination without abnormal findings: Secondary | ICD-10-CM | POA: Diagnosis not present

## 2018-08-24 DIAGNOSIS — M9904 Segmental and somatic dysfunction of sacral region: Secondary | ICD-10-CM | POA: Diagnosis not present

## 2018-08-24 DIAGNOSIS — M9905 Segmental and somatic dysfunction of pelvic region: Secondary | ICD-10-CM | POA: Diagnosis not present

## 2018-08-24 DIAGNOSIS — M9903 Segmental and somatic dysfunction of lumbar region: Secondary | ICD-10-CM | POA: Diagnosis not present

## 2018-08-24 DIAGNOSIS — M5136 Other intervertebral disc degeneration, lumbar region: Secondary | ICD-10-CM | POA: Diagnosis not present

## 2018-09-01 DIAGNOSIS — M9903 Segmental and somatic dysfunction of lumbar region: Secondary | ICD-10-CM | POA: Diagnosis not present

## 2018-09-01 DIAGNOSIS — M9904 Segmental and somatic dysfunction of sacral region: Secondary | ICD-10-CM | POA: Diagnosis not present

## 2018-09-01 DIAGNOSIS — M5136 Other intervertebral disc degeneration, lumbar region: Secondary | ICD-10-CM | POA: Diagnosis not present

## 2018-09-01 DIAGNOSIS — M9905 Segmental and somatic dysfunction of pelvic region: Secondary | ICD-10-CM | POA: Diagnosis not present

## 2018-09-12 DIAGNOSIS — M9905 Segmental and somatic dysfunction of pelvic region: Secondary | ICD-10-CM | POA: Diagnosis not present

## 2018-09-12 DIAGNOSIS — M9904 Segmental and somatic dysfunction of sacral region: Secondary | ICD-10-CM | POA: Diagnosis not present

## 2018-09-12 DIAGNOSIS — M5136 Other intervertebral disc degeneration, lumbar region: Secondary | ICD-10-CM | POA: Diagnosis not present

## 2018-09-12 DIAGNOSIS — M9903 Segmental and somatic dysfunction of lumbar region: Secondary | ICD-10-CM | POA: Diagnosis not present

## 2018-09-20 DIAGNOSIS — M9903 Segmental and somatic dysfunction of lumbar region: Secondary | ICD-10-CM | POA: Diagnosis not present

## 2018-09-20 DIAGNOSIS — M9904 Segmental and somatic dysfunction of sacral region: Secondary | ICD-10-CM | POA: Diagnosis not present

## 2018-09-20 DIAGNOSIS — M5136 Other intervertebral disc degeneration, lumbar region: Secondary | ICD-10-CM | POA: Diagnosis not present

## 2018-09-20 DIAGNOSIS — M9905 Segmental and somatic dysfunction of pelvic region: Secondary | ICD-10-CM | POA: Diagnosis not present

## 2018-10-04 DIAGNOSIS — M9903 Segmental and somatic dysfunction of lumbar region: Secondary | ICD-10-CM | POA: Diagnosis not present

## 2018-10-04 DIAGNOSIS — M5136 Other intervertebral disc degeneration, lumbar region: Secondary | ICD-10-CM | POA: Diagnosis not present

## 2018-10-04 DIAGNOSIS — M9905 Segmental and somatic dysfunction of pelvic region: Secondary | ICD-10-CM | POA: Diagnosis not present

## 2018-10-04 DIAGNOSIS — M9904 Segmental and somatic dysfunction of sacral region: Secondary | ICD-10-CM | POA: Diagnosis not present

## 2018-10-20 DIAGNOSIS — M9903 Segmental and somatic dysfunction of lumbar region: Secondary | ICD-10-CM | POA: Diagnosis not present

## 2018-10-20 DIAGNOSIS — M9905 Segmental and somatic dysfunction of pelvic region: Secondary | ICD-10-CM | POA: Diagnosis not present

## 2018-10-20 DIAGNOSIS — M9904 Segmental and somatic dysfunction of sacral region: Secondary | ICD-10-CM | POA: Diagnosis not present

## 2018-10-20 DIAGNOSIS — M5136 Other intervertebral disc degeneration, lumbar region: Secondary | ICD-10-CM | POA: Diagnosis not present

## 2018-11-23 DIAGNOSIS — M5136 Other intervertebral disc degeneration, lumbar region: Secondary | ICD-10-CM | POA: Diagnosis not present

## 2018-11-23 DIAGNOSIS — M9903 Segmental and somatic dysfunction of lumbar region: Secondary | ICD-10-CM | POA: Diagnosis not present

## 2018-11-23 DIAGNOSIS — M9904 Segmental and somatic dysfunction of sacral region: Secondary | ICD-10-CM | POA: Diagnosis not present

## 2018-11-23 DIAGNOSIS — M9905 Segmental and somatic dysfunction of pelvic region: Secondary | ICD-10-CM | POA: Diagnosis not present

## 2019-01-10 DIAGNOSIS — M9903 Segmental and somatic dysfunction of lumbar region: Secondary | ICD-10-CM | POA: Diagnosis not present

## 2019-01-10 DIAGNOSIS — M5136 Other intervertebral disc degeneration, lumbar region: Secondary | ICD-10-CM | POA: Diagnosis not present

## 2019-01-10 DIAGNOSIS — M9905 Segmental and somatic dysfunction of pelvic region: Secondary | ICD-10-CM | POA: Diagnosis not present

## 2019-01-10 DIAGNOSIS — M9904 Segmental and somatic dysfunction of sacral region: Secondary | ICD-10-CM | POA: Diagnosis not present

## 2019-02-20 DIAGNOSIS — M5136 Other intervertebral disc degeneration, lumbar region: Secondary | ICD-10-CM | POA: Diagnosis not present

## 2019-02-20 DIAGNOSIS — M9904 Segmental and somatic dysfunction of sacral region: Secondary | ICD-10-CM | POA: Diagnosis not present

## 2019-02-20 DIAGNOSIS — M9905 Segmental and somatic dysfunction of pelvic region: Secondary | ICD-10-CM | POA: Diagnosis not present

## 2019-02-20 DIAGNOSIS — M9903 Segmental and somatic dysfunction of lumbar region: Secondary | ICD-10-CM | POA: Diagnosis not present

## 2019-04-27 DIAGNOSIS — M5136 Other intervertebral disc degeneration, lumbar region: Secondary | ICD-10-CM | POA: Diagnosis not present

## 2019-04-27 DIAGNOSIS — M9905 Segmental and somatic dysfunction of pelvic region: Secondary | ICD-10-CM | POA: Diagnosis not present

## 2019-04-27 DIAGNOSIS — M9904 Segmental and somatic dysfunction of sacral region: Secondary | ICD-10-CM | POA: Diagnosis not present

## 2019-04-27 DIAGNOSIS — M9903 Segmental and somatic dysfunction of lumbar region: Secondary | ICD-10-CM | POA: Diagnosis not present

## 2019-05-04 DIAGNOSIS — M9905 Segmental and somatic dysfunction of pelvic region: Secondary | ICD-10-CM | POA: Diagnosis not present

## 2019-05-04 DIAGNOSIS — M5136 Other intervertebral disc degeneration, lumbar region: Secondary | ICD-10-CM | POA: Diagnosis not present

## 2019-05-04 DIAGNOSIS — M9903 Segmental and somatic dysfunction of lumbar region: Secondary | ICD-10-CM | POA: Diagnosis not present

## 2019-05-04 DIAGNOSIS — M9904 Segmental and somatic dysfunction of sacral region: Secondary | ICD-10-CM | POA: Diagnosis not present

## 2019-05-11 DIAGNOSIS — M5136 Other intervertebral disc degeneration, lumbar region: Secondary | ICD-10-CM | POA: Diagnosis not present

## 2019-05-11 DIAGNOSIS — M9905 Segmental and somatic dysfunction of pelvic region: Secondary | ICD-10-CM | POA: Diagnosis not present

## 2019-05-11 DIAGNOSIS — M9903 Segmental and somatic dysfunction of lumbar region: Secondary | ICD-10-CM | POA: Diagnosis not present

## 2019-05-11 DIAGNOSIS — M9904 Segmental and somatic dysfunction of sacral region: Secondary | ICD-10-CM | POA: Diagnosis not present

## 2019-05-25 DIAGNOSIS — M9903 Segmental and somatic dysfunction of lumbar region: Secondary | ICD-10-CM | POA: Diagnosis not present

## 2019-05-25 DIAGNOSIS — M9905 Segmental and somatic dysfunction of pelvic region: Secondary | ICD-10-CM | POA: Diagnosis not present

## 2019-05-25 DIAGNOSIS — M9904 Segmental and somatic dysfunction of sacral region: Secondary | ICD-10-CM | POA: Diagnosis not present

## 2019-05-25 DIAGNOSIS — M5136 Other intervertebral disc degeneration, lumbar region: Secondary | ICD-10-CM | POA: Diagnosis not present

## 2019-05-31 DIAGNOSIS — M9904 Segmental and somatic dysfunction of sacral region: Secondary | ICD-10-CM | POA: Diagnosis not present

## 2019-05-31 DIAGNOSIS — M9905 Segmental and somatic dysfunction of pelvic region: Secondary | ICD-10-CM | POA: Diagnosis not present

## 2019-05-31 DIAGNOSIS — M5136 Other intervertebral disc degeneration, lumbar region: Secondary | ICD-10-CM | POA: Diagnosis not present

## 2019-05-31 DIAGNOSIS — M9903 Segmental and somatic dysfunction of lumbar region: Secondary | ICD-10-CM | POA: Diagnosis not present

## 2019-06-07 DIAGNOSIS — M9903 Segmental and somatic dysfunction of lumbar region: Secondary | ICD-10-CM | POA: Diagnosis not present

## 2019-06-07 DIAGNOSIS — M9905 Segmental and somatic dysfunction of pelvic region: Secondary | ICD-10-CM | POA: Diagnosis not present

## 2019-06-07 DIAGNOSIS — M5136 Other intervertebral disc degeneration, lumbar region: Secondary | ICD-10-CM | POA: Diagnosis not present

## 2019-06-07 DIAGNOSIS — M9904 Segmental and somatic dysfunction of sacral region: Secondary | ICD-10-CM | POA: Diagnosis not present

## 2019-06-14 DIAGNOSIS — M9905 Segmental and somatic dysfunction of pelvic region: Secondary | ICD-10-CM | POA: Diagnosis not present

## 2019-06-14 DIAGNOSIS — M9903 Segmental and somatic dysfunction of lumbar region: Secondary | ICD-10-CM | POA: Diagnosis not present

## 2019-06-14 DIAGNOSIS — M5136 Other intervertebral disc degeneration, lumbar region: Secondary | ICD-10-CM | POA: Diagnosis not present

## 2019-06-14 DIAGNOSIS — M9904 Segmental and somatic dysfunction of sacral region: Secondary | ICD-10-CM | POA: Diagnosis not present

## 2019-06-28 DIAGNOSIS — M9903 Segmental and somatic dysfunction of lumbar region: Secondary | ICD-10-CM | POA: Diagnosis not present

## 2019-06-28 DIAGNOSIS — M9904 Segmental and somatic dysfunction of sacral region: Secondary | ICD-10-CM | POA: Diagnosis not present

## 2019-06-28 DIAGNOSIS — M5136 Other intervertebral disc degeneration, lumbar region: Secondary | ICD-10-CM | POA: Diagnosis not present

## 2019-06-28 DIAGNOSIS — M9905 Segmental and somatic dysfunction of pelvic region: Secondary | ICD-10-CM | POA: Diagnosis not present

## 2019-07-12 DIAGNOSIS — M9904 Segmental and somatic dysfunction of sacral region: Secondary | ICD-10-CM | POA: Diagnosis not present

## 2019-07-12 DIAGNOSIS — M5136 Other intervertebral disc degeneration, lumbar region: Secondary | ICD-10-CM | POA: Diagnosis not present

## 2019-07-12 DIAGNOSIS — M9903 Segmental and somatic dysfunction of lumbar region: Secondary | ICD-10-CM | POA: Diagnosis not present

## 2019-07-12 DIAGNOSIS — M9905 Segmental and somatic dysfunction of pelvic region: Secondary | ICD-10-CM | POA: Diagnosis not present

## 2019-07-26 DIAGNOSIS — M5136 Other intervertebral disc degeneration, lumbar region: Secondary | ICD-10-CM | POA: Diagnosis not present

## 2019-07-26 DIAGNOSIS — M9905 Segmental and somatic dysfunction of pelvic region: Secondary | ICD-10-CM | POA: Diagnosis not present

## 2019-07-26 DIAGNOSIS — M9904 Segmental and somatic dysfunction of sacral region: Secondary | ICD-10-CM | POA: Diagnosis not present

## 2019-07-26 DIAGNOSIS — M9903 Segmental and somatic dysfunction of lumbar region: Secondary | ICD-10-CM | POA: Diagnosis not present

## 2019-08-16 DIAGNOSIS — M9903 Segmental and somatic dysfunction of lumbar region: Secondary | ICD-10-CM | POA: Diagnosis not present

## 2019-08-16 DIAGNOSIS — M9905 Segmental and somatic dysfunction of pelvic region: Secondary | ICD-10-CM | POA: Diagnosis not present

## 2019-08-16 DIAGNOSIS — M5136 Other intervertebral disc degeneration, lumbar region: Secondary | ICD-10-CM | POA: Diagnosis not present

## 2019-08-16 DIAGNOSIS — M9904 Segmental and somatic dysfunction of sacral region: Secondary | ICD-10-CM | POA: Diagnosis not present

## 2019-08-17 DIAGNOSIS — H5212 Myopia, left eye: Secondary | ICD-10-CM | POA: Diagnosis not present

## 2019-08-29 ENCOUNTER — Encounter: Payer: Self-pay | Admitting: Family Medicine

## 2019-08-29 DIAGNOSIS — Z1231 Encounter for screening mammogram for malignant neoplasm of breast: Secondary | ICD-10-CM | POA: Diagnosis not present

## 2019-09-06 DIAGNOSIS — M9905 Segmental and somatic dysfunction of pelvic region: Secondary | ICD-10-CM | POA: Diagnosis not present

## 2019-09-06 DIAGNOSIS — M5136 Other intervertebral disc degeneration, lumbar region: Secondary | ICD-10-CM | POA: Diagnosis not present

## 2019-09-06 DIAGNOSIS — M9903 Segmental and somatic dysfunction of lumbar region: Secondary | ICD-10-CM | POA: Diagnosis not present

## 2019-09-06 DIAGNOSIS — M9904 Segmental and somatic dysfunction of sacral region: Secondary | ICD-10-CM | POA: Diagnosis not present

## 2019-09-27 DIAGNOSIS — M9903 Segmental and somatic dysfunction of lumbar region: Secondary | ICD-10-CM | POA: Diagnosis not present

## 2019-09-27 DIAGNOSIS — M5136 Other intervertebral disc degeneration, lumbar region: Secondary | ICD-10-CM | POA: Diagnosis not present

## 2019-09-27 DIAGNOSIS — M9904 Segmental and somatic dysfunction of sacral region: Secondary | ICD-10-CM | POA: Diagnosis not present

## 2019-09-27 DIAGNOSIS — M9905 Segmental and somatic dysfunction of pelvic region: Secondary | ICD-10-CM | POA: Diagnosis not present

## 2019-10-05 ENCOUNTER — Ambulatory Visit (INDEPENDENT_AMBULATORY_CARE_PROVIDER_SITE_OTHER): Payer: Medicare Other | Admitting: Family Medicine

## 2019-10-05 ENCOUNTER — Other Ambulatory Visit: Payer: Self-pay

## 2019-10-05 ENCOUNTER — Encounter: Payer: Self-pay | Admitting: Family Medicine

## 2019-10-05 VITALS — BP 124/68 | HR 85 | Temp 97.9°F | Ht 63.25 in | Wt 123.3 lb

## 2019-10-05 DIAGNOSIS — R7303 Prediabetes: Secondary | ICD-10-CM

## 2019-10-05 DIAGNOSIS — Z0000 Encounter for general adult medical examination without abnormal findings: Secondary | ICD-10-CM

## 2019-10-05 DIAGNOSIS — Z1211 Encounter for screening for malignant neoplasm of colon: Secondary | ICD-10-CM

## 2019-10-05 DIAGNOSIS — E7800 Pure hypercholesterolemia, unspecified: Secondary | ICD-10-CM | POA: Diagnosis not present

## 2019-10-05 DIAGNOSIS — N952 Postmenopausal atrophic vaginitis: Secondary | ICD-10-CM

## 2019-10-05 LAB — COMPREHENSIVE METABOLIC PANEL
ALT: 25 U/L (ref 0–35)
AST: 24 U/L (ref 0–37)
Albumin: 4.5 g/dL (ref 3.5–5.2)
Alkaline Phosphatase: 64 U/L (ref 39–117)
BUN: 14 mg/dL (ref 6–23)
CO2: 31 mEq/L (ref 19–32)
Calcium: 10 mg/dL (ref 8.4–10.5)
Chloride: 100 mEq/L (ref 96–112)
Creatinine, Ser: 0.61 mg/dL (ref 0.40–1.20)
GFR: 95.83 mL/min (ref >60.00)
Glucose, Bld: 109 mg/dL — ABNORMAL HIGH (ref 70–99)
Potassium: 4 mEq/L (ref 3.5–5.1)
Sodium: 137 mEq/L (ref 135–145)
Total Bilirubin: 0.5 mg/dL (ref 0.2–1.2)
Total Protein: 6.8 g/dL (ref 6.0–8.3)

## 2019-10-05 LAB — CBC WITH DIFFERENTIAL/PLATELET
Basophils Absolute: 0 10*3/uL (ref 0.0–0.1)
Basophils Relative: 0.3 % (ref 0.0–3.0)
Eosinophils Absolute: 0.1 10*3/uL (ref 0.0–0.7)
Eosinophils Relative: 0.7 % (ref 0.0–5.0)
HCT: 41.6 % (ref 36.0–46.0)
Hemoglobin: 13.8 g/dL (ref 12.0–15.0)
Lymphocytes Relative: 30.1 % (ref 12.0–46.0)
Lymphs Abs: 2.3 10*3/uL (ref 0.7–4.0)
MCHC: 33.2 g/dL (ref 30.0–36.0)
MCV: 92.9 fl (ref 78.0–100.0)
Monocytes Absolute: 0.5 10*3/uL (ref 0.1–1.0)
Monocytes Relative: 6.6 % (ref 3.0–12.0)
Neutro Abs: 4.7 10*3/uL (ref 1.4–7.7)
Neutrophils Relative %: 62.3 % (ref 43.0–77.0)
Platelets: 246 10*3/uL (ref 150.0–400.0)
RBC: 4.48 Mil/uL (ref 3.87–5.11)
RDW: 13.3 % (ref 11.5–15.5)
WBC: 7.5 10*3/uL (ref 4.0–10.5)

## 2019-10-05 LAB — LIPID PANEL
Cholesterol: 233 mg/dL — ABNORMAL HIGH (ref 0–200)
HDL: 52.6 mg/dL (ref >39.00)
LDL Cholesterol: 143 mg/dL — ABNORMAL HIGH (ref 0–99)
NonHDL: 180.5
Total CHOL/HDL Ratio: 4
Triglycerides: 188 mg/dL — ABNORMAL HIGH (ref 0.0–149.0)
VLDL: 37.6 mg/dL (ref 0.0–40.0)

## 2019-10-05 LAB — HEMOGLOBIN A1C: Hgb A1c MFr Bld: 5.8 % (ref 4.6–6.5)

## 2019-10-05 MED ORDER — ESTRADIOL 0.1 MG/GM VA CREA: TOPICAL_CREAM | VAGINAL | 5 refills | Status: DC

## 2019-10-05 NOTE — Assessment & Plan Note (Signed)
Due for labs  Diet is fairly healthy  Disc goals for lipids and reasons to control them Rev last labs with pt Rev low sat fat diet in detail

## 2019-10-05 NOTE — Assessment & Plan Note (Signed)
Estrace cream is helpful -doing well

## 2019-10-05 NOTE — Assessment & Plan Note (Signed)
Reviewed health habits including diet and exercise and skin cancer prevention Reviewed appropriate screening tests for age  Also reviewed health mt list, fam hx and immunization status , as well as social and family history   See HPI Labs reviewed  Pt plans to call for a colonoscopy referral in January Declines dexa  No falls/ one remote foot fx, taking ca and D Also exercising Has utd advance directive No cognitive concerns Wears hearing aides/ working well  utd eye and vision care  

## 2019-10-05 NOTE — Assessment & Plan Note (Signed)
Reviewed health habits including diet and exercise and skin cancer prevention Reviewed appropriate screening tests for age  Also reviewed health mt list, fam hx and immunization status , as well as social and family history   See HPI Labs reviewed  Pt plans to call for a colonoscopy referral in January Declines dexa  No falls/ one remote foot fx, taking ca and D Also exercising Has utd advance directive No cognitive concerns Wears hearing aides/ working well  utd eye and vision care

## 2019-10-05 NOTE — Assessment & Plan Note (Signed)
Pt wants to consider colonoscopy in the winter-will call to schedule If she changes her mind will give ifob kit Does not want to try cologuard again

## 2019-10-05 NOTE — Patient Instructions (Addendum)
I would like you to consider a colonoscopy Call us when you are ready-if not we will give you a stool card test   Try to get 64 oz of fluid daily - mostly water   Labs today   Take care of yourself

## 2019-10-05 NOTE — Assessment & Plan Note (Signed)
Due for labs/A1C today disc imp of low glycemic diet and wt loss to prevent DM2

## 2019-10-05 NOTE — Progress Notes (Signed)
Subjective:    Patient ID: Carol Chavez, female    DOB: August 09, 1946, 73 y.o.   MRN: [id number]  HPI  Here for amw and health mt exam as well as rev of chronic medical problems   I have personally reviewed the Medicare Annual Wellness questionnaire and have noted 1. The patient's medical and social history 2. Their use of alcohol, tobacco or illicit drugs 3. Their current medications and supplements 4. The patient's functional ability including ADL's, fall risks, home safety risks and hearing or visual             impairment. 5. Diet and physical activities 6. Evidence for depression or mood disorders  The patients weight, height, BMI have been recorded in the chart and visual acuity is per eye clinic.  I have made referrals, counseling and provided education to the patient based review of the above and I have provided the pt with a written personalized care plan for preventive services. Reviewed and updated provider list, see scanned forms.  See scanned forms.  Routine anticipatory guidance given to patient.  See health maintenance. Colon cancer screening -ifob neg 2-14 , recent FIT test 08/15/18 negative  Last time ordered cologuard  Interested in colonoscopy after jan 1  Breast cancer screening mammogram 9/20 Self breast exam-no lumps or changes  Flu vaccine 10/20 Tetanus vaccine 9/13 Tdap Pneumovax completed Zoster vaccine -had the shingrix series  Dexa-declines  Falls-none  Fractures-foot  Supplements- takes ca and D  Exercise -regular /walking   Advance directive-up to date living will and poa  Cognitive function addressed- see scanned forms- and if abnormal then additional documentation follows.  Above and beyond walking in a room and forgetting why-nothing  Does her own finances  Did already vote  No cancer in the family   Mother has CAD with stents  Had first event in 31s    PMH and SH reviewed  Meds, vitals, and allergies reviewed.   ROS: See HPI.   Otherwise negative.    Weight : Wt Readings from Last 3 Encounters:  10/05/19 123 lb 5 oz (55.9 kg)  03/07/18 127 lb 8 oz (57.8 kg)  07/27/16 123 lb 12 oz (56.1 kg)  eats fairly healthy She exercises  21.67 kg/m   Hearing/vision:  Hearing Screening   _0  _1  _2  _3  _4  _5  _6  _7  _8   Right ear:           Left ear:           Comments: Pt wears hearing aids  Vision Screening Comments: Pt had eye exam with Dr. Verlon Setting in Aug 2020 hearing aides are working well  No vision changes    Due for labs Prediabetes Lab Results  Component Value Date   HGBA1C 5.9 03/07/2018   Hyperlipidemia Lab Results  Component Value Date   CHOL 188 03/07/2018   HDL 50.00 03/07/2018   LDLCALC 111 (H) 03/07/2018   LDLDIRECT 165.9 06/06/2013   TRIG 135.0 03/07/2018   CHOLHDL 4 03/07/2018   She is prone to chronic leg cramps  Went to [employer] -thought it was from her low back  Improved now   Dry mouth- occ clears throat  May not drink enough water   Patient Active Problem List   Diagnosis Date Noted  . Routine general medical examination at a health care facility 07/27/2016  . Hyponatremia 04/26/2016  . Prediabetes 04/26/2016  . Encounter for Medicare annual wellness exam 06/13/2013  . Colon cancer screening 06/13/2013  .  RHINITIS 03/04/2010  . Hyperlipidemia 07/25/2009  . VAGINITIS, ATROPHIC, POSTMENOPAUSAL 07/25/2009   History reviewed. No pertinent past medical history. History reviewed. No pertinent surgical history. Social History   Tobacco Use  . Smoking status: Never Smoker  . Smokeless tobacco: Never Used  Substance Use Topics  . Alcohol use: Yes    Comment: occ  . Drug use: No   History reviewed. No pertinent family history. No Known Allergies Current Outpatient Medications on File Prior to Visit  Medication Sig Dispense Refill  . CALCIUM PO Take 1 capsule by mouth at bedtime.    . Cholecalciferol (VITAMIN D PO) Take 1 capsule by mouth  daily.     . LUTEIN PO Take 1 capsule by mouth daily.     Marland Kitchen MAGNESIUM PO Take 1 tablet by mouth 2 (two) times daily.    . Multiple Vitamin (MULTIVITAMIN) capsule Take 1 capsule by mouth daily.    Marland Kitchen POTASSIUM PO Take 1 tablet by mouth daily.     No current facility-administered medications on file prior to visit.      Review of Systems  Constitutional: Negative for activity change, appetite change, fatigue, fever and unexpected weight change.  HENT: Negative for congestion, ear pain, rhinorrhea, sinus pressure and sore throat.        C/o dry mouth  Eyes: Negative for pain, redness and visual disturbance.  Respiratory: Negative for cough, shortness of breath and wheezing.   Cardiovascular: Negative for chest pain and palpitations.  Gastrointestinal: Negative for abdominal pain, blood in stool, constipation and diarrhea.  Endocrine: Negative for polydipsia and polyuria.  Genitourinary: Negative for dysuria, frequency and urgency.  Musculoskeletal: Negative for arthralgias, back pain and myalgias.       Chronic leg cramps - improved with chiropractic care  Skin: Negative for pallor and rash.  Allergic/Immunologic: Negative for environmental allergies.  Neurological: Negative for dizziness, syncope and headaches.  Hematological: Negative for adenopathy. Does not bruise/bleed easily.  Psychiatric/Behavioral: Negative for decreased concentration and dysphoric mood. The patient is not nervous/anxious.        Objective:   Physical Exam Constitutional:      General: She is not in acute distress.    Appearance: Normal appearance. She is well-developed and normal weight. She is not ill-appearing or diaphoretic.  HENT:     Head: Normocephalic and atraumatic.     Right Ear: Tympanic membrane, ear canal and external ear normal.     Left Ear: Tympanic membrane, ear canal and external ear normal.     Nose: Nose normal. No congestion.     Mouth/Throat:     Mouth: Mucous membranes are moist.      Pharynx: Oropharynx is clear. No posterior oropharyngeal erythema.  Eyes:     General: No scleral icterus.    Extraocular Movements: Extraocular movements intact.     Conjunctiva/sclera: Conjunctivae normal.     Pupils: Pupils are equal, round, and reactive to light.  Neck:     Musculoskeletal: Normal range of motion and neck supple. No neck rigidity or muscular tenderness.     Thyroid: No thyromegaly.     Vascular: No carotid bruit or JVD.  Cardiovascular:     Rate and Rhythm: Normal rate and regular rhythm.     Pulses: Normal pulses.     Heart sounds: Normal heart sounds. No gallop.   Pulmonary:     Effort: Pulmonary effort is normal. No respiratory distress.     Breath sounds: Normal breath sounds. No wheezing.  Comments: Good air exch Chest:     Chest wall: No tenderness.  Abdominal:     General: Bowel sounds are normal. There is no distension or abdominal bruit.     Palpations: Abdomen is soft. There is no mass.     Tenderness: There is no abdominal tenderness.     Hernia: No hernia is present.  Genitourinary:    Comments: Breast exam: No mass, nodules, thickening, tenderness, bulging, retraction, inflamation, nipple discharge or skin changes noted.  No axillary or clavicular LA.     Musculoskeletal: Normal range of motion.        General: No tenderness.     Right lower leg: No edema.     Left lower leg: No edema.  Lymphadenopathy:     Cervical: No cervical adenopathy.  Skin:    General: Skin is warm and dry.     Coloration: Skin is not pale.     Findings: No erythema or rash.     Comments: Solar lentigines diffusely   Neurological:     Mental Status: She is alert. Mental status is at baseline.     Cranial Nerves: No cranial nerve deficit.     Motor: No abnormal muscle tone.     Coordination: Coordination normal.     Gait: Gait normal.     Deep Tendon Reflexes: Reflexes are normal and symmetric.  Psychiatric:        Mood and Affect: Mood normal.         Cognition and Memory: Cognition and memory normal.           Assessment & Plan:   Problem List Items Addressed This Visit      Genitourinary   VAGINITIS, ATROPHIC, POSTMENOPAUSAL    Estrace cream is helpful -doing well        Other   Hyperlipidemia    Due for labs  Diet is fairly healthy  Disc goals for lipids and reasons to control them Rev last labs with pt Rev low sat fat diet in detail       Relevant Orders   Comprehensive metabolic panel   Lipid panel   Encounter for Medicare annual wellness exam - Primary    Reviewed health habits including diet and exercise and skin cancer prevention Reviewed appropriate screening tests for age  Also reviewed health mt list, fam hx and immunization status , as well as social and family history   See HPI Labs reviewed  Pt plans to call for a colonoscopy referral in January Declines dexa  No falls/ one remote foot fx, taking ca and D Also exercising Has utd advance directive No cognitive concerns Wears hearing aides/ working well  [facility] eye [facility] vision [facility]       Colon cancer screening    Pt wants to consider colonoscopy in the winter-will call to schedule If she changes her mind will give ifob kit Does not want to try cologuard again      Prediabetes    Due for labs/A1C today disc imp of low glycemic diet and wt loss to prevent DM2       Relevant Orders   Hemoglobin A1c   Routine general medical examination at a health care facility    Reviewed health habits including diet and exercise and skin cancer prevention Reviewed appropriate screening tests for age  Also reviewed health mt list, fam hx and immunization status , as well as social and family history   See HPI Labs reviewed  Pt  plans to call for a colonoscopy referral in January Declines dexa  No falls/ one remote foot fx, taking ca and D Also exercising Has utd advance directive No cognitive concerns Wears hearing aides/ working well  utd eye and vision  care       Relevant Orders   CBC with Differential/Platelet   TSH

## 2019-10-06 LAB — TSH: TSH: 2.36 u[IU]/mL (ref 0.35–4.50)

## 2019-10-09 DIAGNOSIS — M48061 Spinal stenosis, lumbar region without neurogenic claudication: Secondary | ICD-10-CM | POA: Diagnosis not present

## 2019-10-09 DIAGNOSIS — M545 Low back pain: Secondary | ICD-10-CM | POA: Diagnosis not present

## 2019-10-23 DIAGNOSIS — M9905 Segmental and somatic dysfunction of pelvic region: Secondary | ICD-10-CM | POA: Diagnosis not present

## 2019-10-23 DIAGNOSIS — M5136 Other intervertebral disc degeneration, lumbar region: Secondary | ICD-10-CM | POA: Diagnosis not present

## 2019-10-23 DIAGNOSIS — M9904 Segmental and somatic dysfunction of sacral region: Secondary | ICD-10-CM | POA: Diagnosis not present

## 2019-10-23 DIAGNOSIS — M9903 Segmental and somatic dysfunction of lumbar region: Secondary | ICD-10-CM | POA: Diagnosis not present

## 2019-10-31 ENCOUNTER — Other Ambulatory Visit: Payer: Self-pay

## 2019-10-31 DIAGNOSIS — Z20822 Contact with and (suspected) exposure to covid-19: Secondary | ICD-10-CM

## 2019-11-02 LAB — NOVEL CORONAVIRUS, NAA: SARS-CoV-2, NAA: NOT DETECTED

## 2019-11-14 DIAGNOSIS — M9905 Segmental and somatic dysfunction of pelvic region: Secondary | ICD-10-CM | POA: Diagnosis not present

## 2019-11-14 DIAGNOSIS — M9903 Segmental and somatic dysfunction of lumbar region: Secondary | ICD-10-CM | POA: Diagnosis not present

## 2019-11-14 DIAGNOSIS — M9904 Segmental and somatic dysfunction of sacral region: Secondary | ICD-10-CM | POA: Diagnosis not present

## 2019-11-14 DIAGNOSIS — M5136 Other intervertebral disc degeneration, lumbar region: Secondary | ICD-10-CM | POA: Diagnosis not present

## 2019-11-16 DIAGNOSIS — M9903 Segmental and somatic dysfunction of lumbar region: Secondary | ICD-10-CM | POA: Diagnosis not present

## 2019-11-16 DIAGNOSIS — M9904 Segmental and somatic dysfunction of sacral region: Secondary | ICD-10-CM | POA: Diagnosis not present

## 2019-11-16 DIAGNOSIS — M9905 Segmental and somatic dysfunction of pelvic region: Secondary | ICD-10-CM | POA: Diagnosis not present

## 2019-11-16 DIAGNOSIS — M5136 Other intervertebral disc degeneration, lumbar region: Secondary | ICD-10-CM | POA: Diagnosis not present

## 2019-11-27 ENCOUNTER — Encounter: Payer: Self-pay | Admitting: Family Medicine

## 2019-11-27 ENCOUNTER — Ambulatory Visit: Payer: Medicare Other | Admitting: Family Medicine

## 2019-11-27 ENCOUNTER — Other Ambulatory Visit: Payer: Self-pay

## 2019-11-27 DIAGNOSIS — G8929 Other chronic pain: Secondary | ICD-10-CM | POA: Diagnosis not present

## 2019-11-27 DIAGNOSIS — M545 Low back pain, unspecified: Secondary | ICD-10-CM

## 2019-11-27 MED ORDER — GABAPENTIN 100 MG PO CAPS: ORAL_CAPSULE | ORAL | 3 refills | Status: DC

## 2019-11-27 NOTE — Progress Notes (Signed)
I saw and examined the patient with Dr. Mayer Masker and agree with assessment and plan as outlined.    Chronic left posterior hip and leg pain with hamstring cramping, ankle dorsiflexion weakness.  Degenerative changes per report on x-ray.  Try McKenzie protocol at [facility].  Consider open MRI with valium if fails to improve.

## 2019-11-27 NOTE — Progress Notes (Signed)
Carol Chavez - 73 y.o. female MRN [id number]  Date of birth: 06-23-1946  Office Visit Note: Visit Date: 11/27/2019 PCP: Judy Pimple, MD Referred by: Tower, Audrie Gallus, MD  Subjective: Chief Complaint  Patient presents with  . Lower Back - Pain    Intermittent low back pain - sees Dr. Glendale Chard (chiropractor) once a month x 2 years now.Pain in the left buttock and hamstring ("cramp") x 2 months. Some pain in the inner thigh. NKI.    HPI: Carol Chavez is a 73 y.o. female who comes in today with lower back pain. She reports that she fell off a step 2 years ago, fractured her left 4th metatarsal. At that time, she started having pain in lower back/glut area and felt "uneven" between the two sides. She saw Art with [facility], pain improved. For the past two months, she has had lower back pain with pain in her left buttocks and cramping in her left hamstring. She also feels intermittent numbness in her feet. Pain is worse at night. Cramping occurs throughout the day, unrelated to exercise or body positioning. She has been seeing chiropractor and massage therapist which has helped some with pain.    ROS Otherwise per HPI.  Assessment & Plan: Visit Diagnoses:  1. Chronic left-sided low back pain without sciatica     Plan: Lower back pain with cramping in hamstring and foot numbness/weakness appear to be from lumbar radiculopathy. Unable to personally review x-rays from [facility] but report describe moderate-severe degenerative disc disease in lower lumbar region. Will refer back to Art to do the McKenzie protocol. If no improvement, will obtain MRI to better evaluate.   Meds & Orders:  Meds ordered this encounter  Medications  . gabapentin (NEURONTIN) 100 MG capsule    Sig: 1 PO q HS, may increase to 3 PO q HS if needed/tolerated    Dispense:  90 capsule    Refill:  3   No orders of the defined types were placed in this encounter.   Follow-up: PRN  Procedures: No procedures  performed  No notes on file   Clinical History: No specialty comments available.   She reports that she has never smoked. She has never used smokeless tobacco.  Recent Labs    10/05/19 1039  HGBA1C 5.8    Objective:  VS:  HT:    WT:   BMI:     BP:   HR: bpm  TEMP: ( )  RESP:  Physical Exam  PHYSICAL EXAM: Gen: NAD, alert, cooperative with exam, well-appearing HEENT: clear conjunctiva,  CV:  no edema, capillary refill brisk, normal rate Resp: non-labored Skin: no rashes, normal turgor  Neuro: no gross deficits.  Psych:  alert and oriented  Ortho Exam  Lumbar spine: - Inspection: no gross deformity or asymmetry, swelling or ecchymosis - Palpation: TTP over SI joint, piriformis/ichial tuberosity - ROM: full active ROM of the lumbar spine in flexion and extension without pain - Strength: dorsiflexion and eversion of foot weak bilaterally, weaker in left than right - Neuro: sensation intact in the L4-S1 nerve root distribution b/l, 2+ L4 1+ S1 on left, 2+ S1 on right  - Special testing: Negative straight leg raise, positive slump (generated cramping in thigh)  Imaging: No results found.  Past Medical/Family/Surgical/Social History: Medications & Allergies reviewed per EMR, new medications updated. Patient Active Problem List   Diagnosis Date Noted  . Routine general medical examination at a health care facility 07/27/2016  .  Hyponatremia 04/26/2016  . Prediabetes 04/26/2016  . Encounter for Medicare annual wellness exam 06/13/2013  . Colon cancer screening 06/13/2013  . RHINITIS 03/04/2010  . Hyperlipidemia 07/25/2009  . VAGINITIS, ATROPHIC, POSTMENOPAUSAL 07/25/2009   History reviewed. No pertinent past medical history. History reviewed. No pertinent family history. History reviewed. No pertinent surgical history. Social History   Occupational History  . Not on file  Tobacco Use  . Smoking status: Never Smoker  . Smokeless tobacco: Never Used  Substance  and Sexual Activity  . Alcohol use: Yes    Comment: occ  . Drug use: No  . Sexual activity: Never

## 2019-11-28 DIAGNOSIS — M50321 Other cervical disc degeneration at C4-C5 level: Secondary | ICD-10-CM | POA: Diagnosis not present

## 2019-11-28 DIAGNOSIS — M9902 Segmental and somatic dysfunction of thoracic region: Secondary | ICD-10-CM | POA: Diagnosis not present

## 2019-11-28 DIAGNOSIS — M5134 Other intervertebral disc degeneration, thoracic region: Secondary | ICD-10-CM | POA: Diagnosis not present

## 2019-11-28 DIAGNOSIS — M9901 Segmental and somatic dysfunction of cervical region: Secondary | ICD-10-CM | POA: Diagnosis not present

## 2019-12-05 DIAGNOSIS — M48061 Spinal stenosis, lumbar region without neurogenic claudication: Secondary | ICD-10-CM | POA: Diagnosis not present

## 2019-12-11 DIAGNOSIS — M48061 Spinal stenosis, lumbar region without neurogenic claudication: Secondary | ICD-10-CM | POA: Diagnosis not present

## 2019-12-13 DIAGNOSIS — M48061 Spinal stenosis, lumbar region without neurogenic claudication: Secondary | ICD-10-CM | POA: Diagnosis not present

## 2019-12-14 DIAGNOSIS — M9902 Segmental and somatic dysfunction of thoracic region: Secondary | ICD-10-CM | POA: Diagnosis not present

## 2019-12-14 DIAGNOSIS — M50321 Other cervical disc degeneration at C4-C5 level: Secondary | ICD-10-CM | POA: Diagnosis not present

## 2019-12-14 DIAGNOSIS — M5134 Other intervertebral disc degeneration, thoracic region: Secondary | ICD-10-CM | POA: Diagnosis not present

## 2019-12-14 DIAGNOSIS — M9901 Segmental and somatic dysfunction of cervical region: Secondary | ICD-10-CM | POA: Diagnosis not present

## 2019-12-18 DIAGNOSIS — M48061 Spinal stenosis, lumbar region without neurogenic claudication: Secondary | ICD-10-CM | POA: Diagnosis not present

## 2019-12-20 ENCOUNTER — Encounter: Payer: Self-pay | Admitting: Family Medicine

## 2019-12-20 ENCOUNTER — Ambulatory Visit (INDEPENDENT_AMBULATORY_CARE_PROVIDER_SITE_OTHER): Payer: Medicare Other | Admitting: Family Medicine

## 2019-12-20 DIAGNOSIS — R05 Cough: Secondary | ICD-10-CM

## 2019-12-20 DIAGNOSIS — M48061 Spinal stenosis, lumbar region without neurogenic claudication: Secondary | ICD-10-CM | POA: Diagnosis not present

## 2019-12-20 DIAGNOSIS — M5442 Lumbago with sciatica, left side: Secondary | ICD-10-CM | POA: Diagnosis not present

## 2019-12-20 DIAGNOSIS — G8929 Other chronic pain: Secondary | ICD-10-CM

## 2019-12-20 DIAGNOSIS — R053 Chronic cough: Secondary | ICD-10-CM | POA: Insufficient documentation

## 2019-12-20 MED ORDER — OMEPRAZOLE 20 MG PO CPDR: 20.0000 mg | DELAYED_RELEASE_CAPSULE | Freq: Every day | ORAL | 3 refills | Status: DC

## 2019-12-20 NOTE — Progress Notes (Signed)
Virtual Visit via Telephone Note  I connected with Carol Chavez on 12/20/19 at  3:00 PM EST by telephone and verified that I am speaking with the correct person using two identifiers.  Location: Patient: home Provider: office   I discussed the limitations, risks, security and privacy concerns of performing an evaluation and management service by telephone and the availability of in person appointments. I also discussed with the patient that there may be a patient responsible charge related to this service. The patient expressed understanding and agreed to proceed.  Parties involved in encounter  Patient: Carol Chavez)  Phone number [phone]  Provider:  Roxy Manns MD    History of Present Illness: Pt presents with cough  She has had a dry cough for months  It comes and goes Only during the day (does not wake her up from sleep)  If she gargles with salt water -will spit out mucous that is thick (all clear)   No nasal symptoms (just dry in the am)  Possible pnd - drainage/ makes her clear her throat  The cough follows the throat clearing   No ear pain  No throat pain  No loss of taste or smell    ? Stress related  Mother died in 10/23/2023 (in nursing home [place]) and found out her brother stole all of her money   She did have a covid test when she came back  And it was negative   She does not tend to get heartburn  Sometimes she gets up in the am with the need to burp  After dinner in the evening - feels a sour stomach   Does not usually have allergies   No wt change Wt Readings from Last 3 Encounters:  10/05/19 123 lb 5 oz (55.9 kg)  03/07/18 127 lb 8 oz (57.8 kg)  07/27/16 123 lb 12 oz (56.1 kg)   Has been taking ibuprofen for her leg lately   Has never smoked   She is currently in PT for her leg (low back)  Working on hamstrings and ankles (weak)  Has not been able to do her regular walking  Able to exercise w/o cough   She tried nexium  over the counter a few weeks ago- it did help for a bit     Patient Active Problem List   Diagnosis Date Noted  . Chronic cough 12/20/2019  . Chronic bilateral low back pain with left-sided sciatica 12/20/2019  . Routine general medical examination at a health care facility 07/27/2016  . Hyponatremia 04/26/2016  . Prediabetes 04/26/2016  . Encounter for Medicare annual wellness exam 06/13/2013  . Colon cancer screening 06/13/2013  . Hyperlipidemia 07/25/2009  . VAGINITIS, ATROPHIC, POSTMENOPAUSAL 07/25/2009   History reviewed. No pertinent past medical history. History reviewed. No pertinent surgical history. Social History   Tobacco Use  . Smoking status: Never Smoker  . Smokeless tobacco: Never Used  Substance Use Topics  . Alcohol use: Yes    Comment: occ  . Drug use: No   History reviewed. No pertinent family history. No Known Allergies Current Outpatient Medications on File Prior to Visit  Medication Sig Dispense Refill  . CALCIUM PO Take 1 capsule by mouth at bedtime.    . Cholecalciferol (VITAMIN D PO) Take 1 capsule by mouth daily.     Marland Kitchen estradiol (ESTRACE VAGINAL) 0.1 MG/GM vaginal cream Use a small amount (1-2 cm) intravaginally twice weekly 42.5 g 5  . gabapentin (  NEURONTIN) 100 MG capsule 1 PO q HS, may increase to 3 PO q HS if needed/tolerated 90 capsule 3  . LUTEIN PO Take 1 capsule by mouth daily.     Marland Kitchen MAGNESIUM PO Take 1 tablet by mouth 2 (two) times daily.    . Multiple Vitamin (MULTIVITAMIN) capsule Take 1 capsule by mouth daily.    Marland Kitchen POTASSIUM PO Take 1 tablet by mouth daily.     No current facility-administered medications on file prior to visit.   Review of Systems  Constitutional: Negative for chills, fever and malaise/fatigue.  HENT: Negative for congestion, ear pain, sinus pain and sore throat.   Eyes: Negative for blurred vision, discharge and redness.  Respiratory: Positive for cough. Negative for shortness of breath and stridor.    Cardiovascular: Negative for chest pain, palpitations and leg swelling.  Gastrointestinal: Positive for heartburn. Negative for abdominal pain, diarrhea, nausea and vomiting.       Occ indigestion   Musculoskeletal: Positive for back pain. Negative for myalgias.  Skin: Negative for rash.  Neurological: Negative for dizziness and headaches.   Observations/Objective: Pt sounds well - not distressed/ like her usual self  Not hoarse/ voice is baseline  No cough or audible sob during interview Mood is good  Very talkative and cognitively sharp  Able to hear call well and good historian    Assessment and Plan: Problem List Items Addressed This Visit      Nervous and Auditory   Chronic bilateral low back pain with left-sided sciatica    Pt is doing PT for this  Will plan to work with exercise bike as well  inst to change ibuprofen to diclofenac gel (for stomach) She hopes to be able to get back to walking        Other   Chronic cough    Strongly suspect GERD as a cause  inst to stop oral nsaids (can use diclofenac gel topically)  Px omepraxole 20 mg daily in am  Watch diet for any triggers  Update [place] in 1 mo with progress If symptoms worsen at any time -inst pt to call earlier           Follow Up Instructions: You can take tylenol (acetaminophen) for pain an also use diclofenac gel 1% to affected areas  Stop ibuprofen for now  Also avoid aleve   Continue your PT   Take omeprazole 20 mg once daily in the am (this helps acid reflux which may cause cough)  Take it in the am before breakfast   Call us in a month and let us know how the cough is doing  If worse in the meantime  -call earlier    I discussed the assessment and treatment plan with the patient. The patient was provided an opportunity to ask questions and all were answered. The patient agreed with the plan and demonstrated an understanding of the instructions.   The patient was advised to call back or seek an  in-person evaluation if the symptoms worsen or if the condition fails to improve as anticipated.  I provided 23 minutes of non-face-to-face time during this encounter.   Loura Pardon, MD

## 2019-12-20 NOTE — Assessment & Plan Note (Signed)
Strongly suspect GERD as a cause  inst to stop oral nsaids (can use diclofenac gel topically)  Px omepraxole 20 mg daily in am  Watch diet for any triggers  Update [place] in 1 mo with progress If symptoms worsen at any time -inst pt to call earlier

## 2019-12-20 NOTE — Assessment & Plan Note (Signed)
Pt is doing PT for this  Will plan to work with exercise bike as well  inst to change ibuprofen to diclofenac gel (for stomach) She hopes to be able to get back to walking

## 2019-12-20 NOTE — Patient Instructions (Addendum)
You can take tylenol (acetaminophen) for pain an also use diclofenac gel 1% to affected areas  Stop ibuprofen for now  Also avoid aleve   Continue your PT   Take omeprazole 20 mg once daily in the am (this helps acid reflux which may cause cough)  Take it in the am before breakfast   Call us in a month and let us know how the cough is doing  If worse in the meantime  -call earlier

## 2020-01-02 DIAGNOSIS — M48061 Spinal stenosis, lumbar region without neurogenic claudication: Secondary | ICD-10-CM | POA: Diagnosis not present

## 2020-01-12 ENCOUNTER — Telehealth: Payer: Self-pay | Admitting: Family Medicine

## 2020-01-12 NOTE — Telephone Encounter (Signed)
Please advise 

## 2020-01-12 NOTE — Telephone Encounter (Signed)
Patient called and stated that she has completed her PT and feels about the same. Wants to know what to do from this point.  Please call patient to advise. [phone]

## 2020-01-13 ENCOUNTER — Other Ambulatory Visit: Payer: Self-pay | Admitting: Family Medicine

## 2020-01-13 DIAGNOSIS — M545 Low back pain, unspecified: Secondary | ICD-10-CM

## 2020-01-13 DIAGNOSIS — G8929 Other chronic pain: Secondary | ICD-10-CM

## 2020-01-13 MED ORDER — DIAZEPAM 5 MG PO TABS: ORAL_TABLET | ORAL | 0 refills | Status: DC

## 2020-01-13 NOTE — Telephone Encounter (Signed)
Open MRI ordered, as well as valium.

## 2020-01-15 NOTE — Telephone Encounter (Signed)
I called the patient and advised her of the plan. She voiced understanding and agrees with this.

## 2020-01-16 DIAGNOSIS — M5134 Other intervertebral disc degeneration, thoracic region: Secondary | ICD-10-CM | POA: Diagnosis not present

## 2020-01-16 DIAGNOSIS — M9901 Segmental and somatic dysfunction of cervical region: Secondary | ICD-10-CM | POA: Diagnosis not present

## 2020-01-16 DIAGNOSIS — M9902 Segmental and somatic dysfunction of thoracic region: Secondary | ICD-10-CM | POA: Diagnosis not present

## 2020-01-16 DIAGNOSIS — M50321 Other cervical disc degeneration at C4-C5 level: Secondary | ICD-10-CM | POA: Diagnosis not present

## 2020-01-19 ENCOUNTER — Telehealth: Payer: Self-pay | Admitting: Family Medicine

## 2020-01-19 NOTE — Telephone Encounter (Signed)
Received call from Keitha Butte with [employer] advised Rx for Diazepam is approved until 01-30-2021. The number to contact Morrie Sheldon is [phone] Opt 5

## 2020-01-19 NOTE — Telephone Encounter (Signed)
I called [facility] on [address] and left this info on their voice mail.

## 2020-02-08 ENCOUNTER — Emergency Department (HOSPITAL_COMMUNITY)
Admission: EM | Admit: 2020-02-08 | Discharge: 2020-02-09 | Disposition: A | Discharge status: Home / Self Care | Payer: Medicare Other | Attending: Emergency Medicine | Admitting: Emergency Medicine

## 2020-02-08 ENCOUNTER — Other Ambulatory Visit: Payer: Self-pay

## 2020-02-08 ENCOUNTER — Telehealth: Payer: Self-pay

## 2020-02-08 ENCOUNTER — Ambulatory Visit (INDEPENDENT_AMBULATORY_CARE_PROVIDER_SITE_OTHER)
Admission: EM | Admit: 2020-02-08 | Discharge: 2020-02-08 | Disposition: A | Discharge status: Other Acute Inpatient Hospital | Payer: Medicare Other | Source: Home / Self Care

## 2020-02-08 ENCOUNTER — Encounter (HOSPITAL_COMMUNITY): Payer: Self-pay

## 2020-02-08 ENCOUNTER — Ambulatory Visit: Payer: Medicare Other | Admitting: Family Medicine

## 2020-02-08 DIAGNOSIS — E860 Dehydration: Secondary | ICD-10-CM | POA: Insufficient documentation

## 2020-02-08 DIAGNOSIS — R112 Nausea with vomiting, unspecified: Secondary | ICD-10-CM

## 2020-02-08 DIAGNOSIS — R000 Tachycardia, unspecified: Secondary | ICD-10-CM | POA: Diagnosis not present

## 2020-02-08 DIAGNOSIS — R197 Diarrhea, unspecified: Secondary | ICD-10-CM | POA: Insufficient documentation

## 2020-02-08 DIAGNOSIS — R42 Dizziness and giddiness: Secondary | ICD-10-CM

## 2020-02-08 DIAGNOSIS — Z79899 Other long term (current) drug therapy: Secondary | ICD-10-CM | POA: Insufficient documentation

## 2020-02-08 DIAGNOSIS — Z20822 Contact with and (suspected) exposure to covid-19: Secondary | ICD-10-CM | POA: Insufficient documentation

## 2020-02-08 LAB — COMPREHENSIVE METABOLIC PANEL
ALT: 26 U/L (ref 0–44)
AST: 22 U/L (ref 15–41)
Albumin: 3.5 g/dL (ref 3.5–5.0)
Alkaline Phosphatase: 57 U/L (ref 38–126)
Anion gap: 11 (ref 5–15)
BUN: 11 mg/dL (ref 8–23)
CO2: 25 mmol/L (ref 22–32)
Calcium: 8.9 mg/dL (ref 8.9–10.3)
Chloride: 101 mmol/L (ref 98–111)
Creatinine, Ser: 0.64 mg/dL (ref 0.44–1.00)
GFR calc Af Amer: >60 mL/min (ref >60)
GFR calc non Af Amer: >60 mL/min (ref >60)
Glucose, Bld: 113 mg/dL — ABNORMAL HIGH (ref 70–99)
Potassium: 4.1 mmol/L (ref 3.5–5.1)
Sodium: 137 mmol/L (ref 135–145)
Total Bilirubin: 0.6 mg/dL (ref 0.3–1.2)
Total Protein: 6.2 g/dL — ABNORMAL LOW (ref 6.5–8.1)

## 2020-02-08 LAB — URINALYSIS, ROUTINE W REFLEX MICROSCOPIC
Bacteria, UA: NONE SEEN
Bilirubin Urine: NEGATIVE
Glucose, UA: NEGATIVE mg/dL
Ketones, ur: 20 mg/dL — AB
Leukocytes,Ua: NEGATIVE
Nitrite: NEGATIVE
Protein, ur: NEGATIVE mg/dL
Specific Gravity, Urine: 1.012 (ref 1.005–1.030)
pH: 6 (ref 5.0–8.0)

## 2020-02-08 LAB — CBC WITH DIFFERENTIAL/PLATELET
Abs Immature Granulocytes: 0.03 10*3/uL (ref 0.00–0.07)
Basophils Absolute: 0 10*3/uL (ref 0.0–0.1)
Basophils Relative: 0 %
Eosinophils Absolute: 0.1 10*3/uL (ref 0.0–0.5)
Eosinophils Relative: 1 %
HCT: 43.6 % (ref 36.0–46.0)
Hemoglobin: 14.5 g/dL (ref 12.0–15.0)
Immature Granulocytes: 0 %
Lymphocytes Relative: 16 %
Lymphs Abs: 1.2 10*3/uL (ref 0.7–4.0)
MCH: 30.5 pg (ref 26.0–34.0)
MCHC: 33.3 g/dL (ref 30.0–36.0)
MCV: 91.8 fL (ref 80.0–100.0)
Monocytes Absolute: 1 10*3/uL (ref 0.1–1.0)
Monocytes Relative: 13 %
Neutro Abs: 5.2 10*3/uL (ref 1.7–7.7)
Neutrophils Relative %: 70 %
Platelets: 254 10*3/uL (ref 150–400)
RBC: 4.75 MIL/uL (ref 3.87–5.11)
RDW: 12.9 % (ref 11.5–15.5)
WBC: 7.6 10*3/uL (ref 4.0–10.5)
nRBC: 0 % (ref 0.0–0.2)

## 2020-02-08 LAB — CBC
HCT: 49.3 % — ABNORMAL HIGH (ref 36.0–46.0)
Hemoglobin: 16.4 g/dL — ABNORMAL HIGH (ref 12.0–15.0)
MCH: 30.4 pg (ref 26.0–34.0)
MCHC: 33.3 g/dL (ref 30.0–36.0)
MCV: 91.5 fL (ref 80.0–100.0)
Platelets: 301 10*3/uL (ref 150–400)
RBC: 5.39 MIL/uL — ABNORMAL HIGH (ref 3.87–5.11)
RDW: 12.8 % (ref 11.5–15.5)
WBC: 15.2 10*3/uL — ABNORMAL HIGH (ref 4.0–10.5)
nRBC: 0 % (ref 0.0–0.2)

## 2020-02-08 LAB — TROPONIN I (HIGH SENSITIVITY): Troponin I (High Sensitivity): 7 ng/L (ref <18)

## 2020-02-08 LAB — BASIC METABOLIC PANEL
Anion gap: 14 (ref 5–15)
BUN: 13 mg/dL (ref 8–23)
CO2: 25 mmol/L (ref 22–32)
Calcium: 9.2 mg/dL (ref 8.9–10.3)
Chloride: 97 mmol/L — ABNORMAL LOW (ref 98–111)
Creatinine, Ser: 0.64 mg/dL (ref 0.44–1.00)
GFR calc Af Amer: >60 mL/min (ref >60)
GFR calc non Af Amer: >60 mL/min (ref >60)
Glucose, Bld: 152 mg/dL — ABNORMAL HIGH (ref 70–99)
Potassium: 4.3 mmol/L (ref 3.5–5.1)
Sodium: 136 mmol/L (ref 135–145)

## 2020-02-08 LAB — MAGNESIUM: Magnesium: 2.1 mg/dL (ref 1.7–2.4)

## 2020-02-08 MED ORDER — SODIUM CHLORIDE 0.9 % IV BOLUS
1000.0000 mL | Freq: Once | INTRAVENOUS | Status: AC
  Administered 2020-02-08: 1000 mL via INTRAVENOUS

## 2020-02-08 MED ORDER — ONDANSETRON HCL 4 MG/2ML IJ SOLN
INTRAMUSCULAR | Status: AC
  Filled 2020-02-08: qty 2

## 2020-02-08 MED ORDER — ONDANSETRON 4 MG PO TBDP: 4.0000 mg | ORAL_TABLET | Freq: Three times a day (TID) | ORAL | 0 refills | Status: DC | PRN

## 2020-02-08 MED ORDER — ONDANSETRON HCL 4 MG/2ML IJ SOLN
4.0000 mg | Freq: Once | INTRAMUSCULAR | Status: AC
  Administered 2020-02-08: 4 mg via INTRAVENOUS

## 2020-02-08 MED ORDER — ONDANSETRON 4 MG PO TBDP
ORAL_TABLET | ORAL | Status: AC
  Filled 2020-02-08: qty 1

## 2020-02-08 MED ORDER — ONDANSETRON 4 MG PO TBDP
4.0000 mg | ORAL_TABLET | Freq: Once | ORAL | Status: AC
  Administered 2020-02-08: 4 mg via ORAL

## 2020-02-08 NOTE — Discharge Instructions (Addendum)
I would like for you to be further evaluated at the Emergency Department at [facility].  Your ECG was not normal. Take the copy of this with you.

## 2020-02-08 NOTE — ED Triage Notes (Addendum)
Pt state she has diarrhea and vomiting 4 days. Pt states she got  Her 2nd Covid shot on Tuesday. Pt state she feels awful.

## 2020-02-08 NOTE — Discharge Instructions (Signed)
Your work-up today was overall reassuring.  I suspect you are dehydrated in the setting of the recurrent nausea vomiting diarrhea as well as had symptoms from your Covid shot.  After fluids, your blood pressure and heart rate improved and you had improvement in your symptoms.  Your labs were otherwise reassuring.  Given the improvement in symptoms and well appearance, we feel you are finally safe for discharge home.  Please use the nausea medicine if you need and follow-up with your primary doctor.  Please rest and stay hydrated.  If any symptoms change or worsen, please return to nearest emergency department.

## 2020-02-08 NOTE — ED Triage Notes (Signed)
Pt started vomiting at 10 this morning. Unable to see PCP d/t vomiting and was told to go to UC. Seen at [facility] and sent here d/t abnormal ekg. Nausea medication given at [facility] with relief. Lab work in process at [facility].

## 2020-02-08 NOTE — ED Notes (Signed)
Pt's husband came to desk agitated and requested an update. Message relayed to primary RN.

## 2020-02-08 NOTE — ED Provider Notes (Signed)
Carol Chavez    CSN: [id number] Arrival date & time: 02/08/20  1115      History   Chief Complaint Chief Complaint  Patient presents with  . Emesis  . Diarrhea    HPI Carol Chavez is a 74 y.o. female.   Patient presents to urgent care today for acute onset of lightheaded feeling and nausea with vomiting.  She is also had 4-5 episodes of diarrhea this morning.  She reports mostly dry heaving this morning.  She reports that her diarrhea has been small-volume liquidy and light brown.  She denies abdominal pain associated with her vomiting or bowel movements.  Denies blood in her vomit or in her stool.  She has had some chills and feels" like she got ran over by a truck".  She denies fever.  She reports similar symptoms 5 days ago on Saturday and Sunday however these all subsided.  She has been eating and drinking mostly normal this week to include Pedialyte.  She was feeling better as the week progressed and woke up with the symptoms above this morning.  She was due for a primary care follow-up for other concerns however was told to come to urgent care if she vomited.  She is also reported lightheaded feeling when standing however denies feeling like she may pass out.  She has had issues with dizziness she is seeing physical therapy for over the last few months.  She has had a cough present since October 2020 but has not reported recent increase.  She denies headache, congestion, abdominal pain.  Denies painful urination, urinary frequency or urgency.  Denies chest pain or shortness of breath.  She does have a history of GERD that she takes omeprazole daily for.     History reviewed. No pertinent past medical history.  Patient Active Problem List   Diagnosis Date Noted  . Chronic cough 12/20/2019  . Chronic bilateral low back pain with left-sided sciatica 12/20/2019  . Routine general medical examination at a health care facility 07/27/2016  . Hyponatremia 04/26/2016  .  Prediabetes 04/26/2016  . Encounter for Medicare annual wellness exam 06/13/2013  . Colon cancer screening 06/13/2013  . Hyperlipidemia 07/25/2009  . VAGINITIS, ATROPHIC, POSTMENOPAUSAL 07/25/2009    History reviewed. No pertinent surgical history.  OB History   No obstetric history on file.      Home Medications    Prior to Admission medications   Medication Sig Start Date End Date Taking? Authorizing Provider  CALCIUM PO Take 1 capsule by mouth at bedtime.    [provider]  Cholecalciferol (VITAMIN D PO) Take 1 capsule by mouth daily.     [provider]  diazepam (VALIUM) 5 MG tablet 1 PO 1 hour before MRI, repeat prn 01/13/20   Hilts, Legrand Como, MD  estradiol (ESTRACE VAGINAL) 0.1 MG/GM vaginal cream Use a small amount (1-2 cm) intravaginally twice weekly 10/05/19   Tower, Wynelle Fanny, MD  gabapentin (NEURONTIN) 100 MG capsule 1 PO q HS, may increase to 3 PO q HS if needed/tolerated 11/27/19   Hilts, Michael, MD  LUTEIN PO Take 1 capsule by mouth daily.     [provider]  MAGNESIUM PO Take 1 tablet by mouth 2 (two) times daily.    [provider]  Multiple Vitamin (MULTIVITAMIN) capsule Take 1 capsule by mouth daily.    [provider]  omeprazole (PRILOSEC) 20 MG capsule Take 1 capsule (20 mg total) by mouth daily. 12/20/19   Tower,  Audrie Gallus, MD  POTASSIUM PO Take 1 tablet by mouth daily.    [provider]    Family History History reviewed. No pertinent family history.  Social History Social History   Tobacco Use  . Smoking status: Never Smoker  . Smokeless tobacco: Never Used  Substance Use Topics  . Alcohol use: Yes    Comment: occ  . Drug use: No     Allergies   Patient has no known allergies.   Review of Systems Review of Systems  Constitutional: Positive for appetite change, chills and fatigue. Negative for fever.  HENT: Negative for congestion, ear pain, sinus pressure, sinus pain and sore throat.     Eyes: Negative for pain and visual disturbance.  Respiratory: Positive for cough. Negative for shortness of breath.   Cardiovascular: Negative for chest pain and palpitations.  Gastrointestinal: Positive for diarrhea, nausea and vomiting. Negative for abdominal pain and blood in stool.  Genitourinary: Negative for dysuria, flank pain, frequency, hematuria and urgency.  Musculoskeletal: Positive for myalgias. Negative for arthralgias and back pain.  Skin: Negative for color change and rash.  Neurological: Positive for light-headedness. Negative for dizziness, seizures, syncope and headaches.  All other systems reviewed and are negative.    Physical Exam Triage Vital Signs ED Triage Vitals  Enc Vitals Group     BP 02/08/20 1153 124/64     Pulse Rate 02/08/20 1153 100     Resp 02/08/20 1153 16     Temp 02/08/20 1153 98.5 F (36.9 C)     Temp Source 02/08/20 1153 Oral     SpO2 02/08/20 1153 100 %     Weight  02/08/20 1147 118 lb (53.5 kg)     Height --      Head Circumference --      Peak Flow --      Pain Score 02/08/20 1147 7     Pain Loc --      Pain Edu? --      Excl. in GC? --    No data found.  Updated Vital Signs BP 124/64 (BP Location: Right Arm)   Pulse 100   Temp 98.5 F (36.9 C) (Oral)   Resp 16   Wt 118 lb (53.5 kg)   SpO2 100%   BMI 20.74 kg/m   Visual Acuity Right Eye Distance:   Left Eye Distance:   Bilateral Distance:    Right Eye Near:   Left Eye Near:    Bilateral Near:     Physical Exam   UC Treatments / Results  Labs (all labs ordered are listed, but only abnormal results are displayed) Labs Reviewed  BASIC METABOLIC PANEL - Abnormal; Notable for the following components:      Result Value   Chloride 97 (*)    Glucose, Bld 152 (*)    All other components within normal limits  CBC - Abnormal; Notable for the following components:   WBC 15.2 (*)    RBC 5.39 (*)    Hemoglobin 16.4 (*)    HCT 49.3 (*)    All other components within  normal limits  NOVEL CORONAVIRUS, NAA (HOSP ORDER, SEND-OUT TO REF LAB; TAT 18-24 HRS)    EKG Sinus tachycardia with suspected partial right bundle blanch block and T wave inversions in V1-V3 -I agree with electronic printout  Radiology No results found.  Procedures Procedures (including critical care time)  Medications Ordered in UC Medications  ondansetron (ZOFRAN-ODT) disintegrating tablet 4 mg (4 mg  Oral Given 02/08/20 1308)  sodium chloride 0.9 % bolus 1,000 mL (0 mLs Intravenous Stopped 02/08/20 1313)  ondansetron (ZOFRAN) injection 4 mg (4 mg Intravenous Given 02/08/20 1310)    Initial Impression / Assessment and Plan / UC Course  I have reviewed the triage vital signs and the nursing notes.  Pertinent labs & imaging results that were available during my care of the patient were reviewed by me and considered in my medical decision making (see chart for details).     #Dehydration #Nausea vomiting diarrhea Patient 74 year old female presenting with 1 week of nausea vomiting and diarrhea symptoms.  Patient had abnormal EKG and urgent care as above without a previous comparison.  Given the setting of this EKG with T wave inversion and recent dehydration and likely electrolyte derangements requiring close monitoring and IV rehydration  decision was to send to emergency department for further evaluation and work-up.  Labs noted above.  Patient agreeable and stable for self transport with spouse to emergency department.  Final Clinical Impressions(s) / UC Diagnoses   Final diagnoses:  Dehydration  Nausea vomiting and diarrhea     Discharge Instructions     I would like for you to be further evaluated at the Emergency Department at [facility]  Your ECG was not normal. Take the copy of this with you.    ED Prescriptions    None     PDMP not reviewed this encounter.   Hermelinda Medicus, PA-C 02/08/20 1515

## 2020-02-08 NOTE — Telephone Encounter (Signed)
I will see her then  

## 2020-02-08 NOTE — ED Provider Notes (Signed)
[place] [facility] EMERGENCY DEPARTMENT Provider Note   CSN: [id number] Arrival date & time: 02/08/20  1323     History Chief Complaint  Patient presents with  . Emesis    Carol Chavez is a 74 y.o. female.  The history is provided by the patient and medical records. No language interpreter was used.  Emesis Severity:  Moderate Duration:  1 week Timing:  Intermittent Quality:  Stomach contents Progression:  Improving Chronicity:  New Recent urination:  Normal Relieved by:  Nothing Worsened by:  Nothing Ineffective treatments:  None tried Associated symptoms: diarrhea   Associated symptoms: no abdominal pain, no chills, no cough, no fever, no headaches and no URI        No past medical history on file.  Patient Active Problem List   Diagnosis Date Noted  . Chronic cough 12/20/2019  . Chronic bilateral low back pain with left-sided sciatica 12/20/2019  . Routine general medical examination at a health care facility 07/27/2016  . Hyponatremia 04/26/2016  . Prediabetes 04/26/2016  . Encounter for Medicare annual wellness exam 06/13/2013  . Colon cancer screening 06/13/2013  . Hyperlipidemia 07/25/2009  . VAGINITIS, ATROPHIC, POSTMENOPAUSAL 07/25/2009    No past surgical history on file.   OB History   No obstetric history on file.     No family history on file.  Social History   Tobacco Use  . Smoking status: Never Smoker  . Smokeless tobacco: Never Used  Substance Use Topics  . Alcohol use: Yes    Comment: occ  . Drug use: No    Home Medications Prior to Admission medications   Medication Sig Start Date End Date Taking? Authorizing Provider  CALCIUM PO Take 1 capsule by mouth at bedtime.    [provider]  Cholecalciferol (VITAMIN D PO) Take 1 capsule by mouth daily.     [provider]  diazepam (VALIUM) 5 MG tablet 1 PO 1 hour before MRI, repeat prn 01/13/20   Hilts, Legrand Como, MD  estradiol (ESTRACE VAGINAL) 0.1 MG/GM  vaginal cream Use a small amount (1-2 cm) intravaginally twice weekly 10/05/19   Tower, Wynelle Fanny, MD  gabapentin (NEURONTIN) 100 MG capsule 1 PO q HS, may increase to 3 PO q HS if needed/tolerated 11/27/19   Hilts, Michael, MD  LUTEIN PO Take 1 capsule by mouth daily.     [provider]  MAGNESIUM PO Take 1 tablet by mouth 2 (two) times daily.    [provider]  Multiple Vitamin (MULTIVITAMIN) capsule Take 1 capsule by mouth daily.    [provider]  omeprazole (PRILOSEC) 20 MG capsule Take 1 capsule (20 mg total) by mouth daily. 12/20/19   Tower, Wynelle Fanny, MD  POTASSIUM PO Take 1 tablet by mouth daily.    [provider]    Allergies    Patient has no known allergies.  Review of Systems   Review of Systems  Constitutional: Positive for fatigue. Negative for chills, diaphoresis and fever.  HENT: Negative for congestion.   Respiratory: Negative for cough, chest tightness, shortness of breath, wheezing and stridor.   Cardiovascular: Negative for chest pain, palpitations and leg swelling.  Gastrointestinal: Positive for diarrhea, nausea and vomiting. Negative for abdominal distention, abdominal pain and constipation.  Genitourinary: Negative for dysuria, flank pain, frequency and pelvic pain.  Musculoskeletal: Negative for back pain, neck pain and neck stiffness.  Skin: Negative for rash and wound.  Neurological: Positive for light-headedness. Negative for dizziness, syncope, weakness  and headaches.  Psychiatric/Behavioral: Negative for agitation and confusion.  All other systems reviewed and are negative.   Physical Exam Updated Vital Signs BP (!) 126/54 (BP Location: Right Arm)   Pulse (!) 107   Temp 98 F (36.7 C) (Oral)   Resp 16   Ht 5' 3.25" (1.607 m)   Wt 53.5 kg   SpO2 98%   BMI 20.74 kg/m   Physical Exam Vitals and nursing note reviewed.  Constitutional:      General: She is not in acute distress.    Appearance: She is  well-developed. She is not ill-appearing, toxic-appearing or diaphoretic.  HENT:     Head: Normocephalic and atraumatic.     Nose: Nose normal. No congestion or rhinorrhea.     Mouth/Throat:     Mouth: Mucous membranes are dry.     Pharynx: No oropharyngeal exudate or posterior oropharyngeal erythema.  Eyes:     Conjunctiva/sclera: Conjunctivae normal.     Pupils: Pupils are equal, round, and reactive to light.  Cardiovascular:     Rate and Rhythm: Regular rhythm. Tachycardia present.     Pulses: Normal pulses.     Heart sounds: No murmur.  Pulmonary:     Effort: Pulmonary effort is normal. No respiratory distress.     Breath sounds: Normal breath sounds. No wheezing, rhonchi or rales.  Chest:     Chest wall: No tenderness.  Abdominal:     General: Abdomen is flat.     Palpations: Abdomen is soft.     Tenderness: There is no abdominal tenderness. There is no right CVA tenderness, left CVA tenderness, guarding or rebound.  Musculoskeletal:        General: No tenderness.     Cervical back: Neck supple. No tenderness.  Skin:    General: Skin is warm and dry.     Findings: No erythema.  Neurological:     General: No focal deficit present.     Mental Status: She is alert and oriented to person, place, and time.     Sensory: No sensory deficit.     Motor: No weakness.  Psychiatric:        Mood and Affect: Mood normal.     ED Results / Procedures / Treatments   Labs (all labs ordered are listed, but only abnormal results are displayed) Labs Reviewed  URINALYSIS, ROUTINE W REFLEX MICROSCOPIC - Abnormal; Notable for the following components:      Result Value   Hgb urine dipstick MODERATE (*)    Ketones, ur 20 (*)    All other components within normal limits  COMPREHENSIVE METABOLIC PANEL - Abnormal; Notable for the following components:   Glucose, Bld 113 (*)    Total Protein 6.2 (*)    All other components within normal limits  MAGNESIUM  CBC WITH DIFFERENTIAL/PLATELET    TROPONIN I (HIGH SENSITIVITY)  TROPONIN I (HIGH SENSITIVITY)    EKG EKG Interpretation  Date/Time:  Thursday February 08 2020 21:24:16 EST Ventricular Rate:  92 PR Interval:    QRS Duration: 124 QT Interval:  376 QTC Calculation: 466 R Axis:   -63 Text Interpretation: Sinus rhythm RBBB and LAFB When compared to ECG earlier today, slower rate. No STEMI Confirmed by Theda Belfast ([id number]) on 02/08/2020 9:26:21 PM   Radiology No results found.  Procedures Procedures (including critical care time)  Medications Ordered in ED Medications  sodium chloride 0.9 % bolus 1,000 mL (0 mLs Intravenous Stopped 02/09/20 0013)    ED  Course  I have reviewed the triage vital signs and the nursing notes.  Pertinent labs & imaging results that were available during my care of the patient were reviewed by me and considered in my medical decision making (see chart for details).    MDM Rules/Calculators/A&P                      Carol Chavez is a 74 y.o. female with a past medical history significant for hyperlipidemia, prior hyponatremia, and chronic back pain who presents with nausea, vomiting, diarrhea, and was sent from urgent care for tachycardia.  Patient reports that she had several days of diarrhea this past weekend including nausea and vomiting.  She then said she was doing better but got her second coronavirus shot 2 days ago.  She reports that since then she has had continued nausea, vomiting, diarrhea and feels dehydrated.  She went to her PCP/urgent care today where she had an EKG showing heart rate in the 120s and she was feeling lightheaded.  She denied any chest pain, palpitations, shortness of breath.  She reports continued nausea vomiting, diarrhea but denies any blood in her emesis or stool.  She denies any pain whatsoever.  She reports feeling better although she does feel very dehydrated and fatigued.  On exam, lungs are clear and chest is nontender.  No murmur.  Abdomen is nontender.   Normal bowel sounds.  Patient moving all extremities.  Patient was tachycardic in the 1 10-1 20 range on my initial evaluation.  She had good pulses in all extremities.  No leg pain or leg swelling.  She denies history of DVT or PE.  She continues to deny any chest pain or shortness of breath.  Clinical aspect she is dehydrated from the nausea vomiting diarrhea.  May be viral versus other.  We discussed that since she did have some EKG abnormalities and his tachycardic with the lightheadedness with standing, we will give her fluids and get some screening labs to look for electrolyte imbalance given her history of the same.  We will also get repeat EKG and watch her on the monitor for telemetry.  We will also check her magnesium and troponin.  If work-up is reassuring, anticipate discharge home after rehydration.  Troponin was negative.  Patient felt much better after fluids and orthostatics were reassuring.  Heart rate is also improved below 100 and she is not hypotensive.  Her nausea is also improved after fluids.  She would like to go home now.  Clinically I have very low suspicion that the patient has any more concerning etiology for her tachycardia other than the dehydration.  Her magnesium was normal and other labs were reassuring.  No history of DVT, PE, chest pain, shortness of breath, palpitations, or leg symptoms.  Symptoms all seem related to the dehydration and the Covid shot.  Patient will follow up with her PCP for further monitoring and management and was given prescription for Zofran.  Suspect the Covid vaccine made her feel worse when she was already dehydrated from recent nausea and vomiting and diarrhea.  Patient agrees with plan of care as well as return precautions.  Other labs reassuring.  Patient discharged in good condition.   Final Clinical Impression(s) / ED Diagnoses Final diagnoses:  None    Rx / DC Orders ED Discharge Orders    None     Clinical Impression: 1.  Dehydration   2. Nausea vomiting and diarrhea  3. Lightheadedness   4. Tachycardia     Disposition: Discharge  Condition: Good  I have discussed the results, Dx and Tx plan with the pt(& family if present). He/she/they expressed understanding and agree(s) with the plan. Discharge instructions discussed at great length. Strict return precautions discussed and pt &/or family have verbalized understanding of the instructions. No further questions at time of discharge.    Discharge Medication List as of 02/08/2020 11:59 PM    START taking these medications   Details  ondansetron (ZOFRAN ODT) 4 MG disintegrating tablet Take 1 tablet (4 mg total) by mouth every 8 (eight) hours as needed for nausea or vomiting., Starting Thu 02/08/2020, Print        Follow Up: Tower, Audrie Gallus, MD [address] [place] [address] [phone]     [facility] EMERGENCY DEPARTMENT [address] [id number] mc [place] [address] [phone]       Melena Hayes, Canary Brim, MD 02/09/20 [phone]

## 2020-02-08 NOTE — ED Notes (Signed)
Patient's husband brought a [employer] account planner. It was given to the patient and the patient's phone was plugged in per request.

## 2020-02-08 NOTE — ED Notes (Signed)
Patient is being discharged from the Urgent Care Center and sent to the Emergency Department via POV. Per JD, patient is stable but in need of higher level of care due to abnormal EKG and N/V. Patient is aware and verbalizes understanding of plan of care.  Vitals:   02/08/20 1153  BP: 124/64  Pulse: 100  Resp: 16  Temp: 98.5 F (36.9 C)  SpO2: 100%

## 2020-02-08 NOTE — Telephone Encounter (Signed)
I spoke with Carol Chavez;Carol Chavez started with dizziness on and off for months; Carol Chavez has been seeing chiropractor for balance issue but has not seen chiropractor in one month.  Carol Chavez had N&V and diarrhea last wk that ended on 01/31/20.  Today Carol Chavez has lightheadedness (room not spinning) and nauseated. Carol Chavez husband is not home and Carol Chavez does not know how to ck BP. No fever and no other covid symptoms. No travel and no known exposure to + covid. Carol Chavez scheduled in office appt with DR Tower 02/08/20 at 11:30.pts husband will drive Carol Chavez to office. appt Oked by Shapale CMA. UC & ED precautions given and Carol Chavez voiced understanding.

## 2020-02-08 NOTE — ED Notes (Signed)
[phone] pts daughter Ernestina Columbia wants an update, will relay info to her father who is in the parking lot waiting

## 2020-02-09 LAB — TROPONIN I (HIGH SENSITIVITY): Troponin I (High Sensitivity): 9 ng/L (ref <18)

## 2020-02-12 LAB — NOVEL CORONAVIRUS, NAA (HOSP ORDER, SEND-OUT TO REF LAB; TAT 18-24 HRS): SARS-CoV-2, NAA: NOT DETECTED

## 2020-02-19 ENCOUNTER — Telehealth: Payer: Self-pay | Admitting: *Deleted

## 2020-02-19 NOTE — Telephone Encounter (Signed)
Please schedule an in office visit (she did have a neg covid test on 3/4 after symptoms started so she can be seen in the office)

## 2020-02-19 NOTE — Telephone Encounter (Signed)
I left a message on patient's voice mail to call back and schedule office visit.

## 2020-02-19 NOTE — Telephone Encounter (Signed)
Pt called back and scheduled an appt for tomorrow.

## 2020-02-19 NOTE — Telephone Encounter (Signed)
Patient called stating that she is still not feeling well. Patient stated that she has had several episodes of vomiting. Patient stated that she had her covid vaccine 13 days ago. Patient stated that she was vomiting Saturday before her vaccine which was the following Tuesday. Patient stated that she had more vomiting Thursday after the vaccine. Patient stated that she has been nauseated, feeling off balanced and having diarrhea off and on. Patient stated that she is not eating well and does not have an appetite. Patient stated that she does have some abdominal soreness and wondering if it could be her gallbladder? Patient stated that she was given acid reflux medication back in January that seems to be helping, but she is having some hoarseness Patient stated that she has not had a fever.  Patient wants to know what Dr. Milinda Antis recommends? Pharmacy [facility]

## 2020-02-20 ENCOUNTER — Encounter: Payer: Self-pay | Admitting: Family Medicine

## 2020-02-20 ENCOUNTER — Ambulatory Visit: Payer: Medicare Other | Admitting: Family Medicine

## 2020-02-20 ENCOUNTER — Other Ambulatory Visit: Payer: Self-pay

## 2020-02-20 VITALS — BP 136/72 | HR 109 | Temp 97.7°F | Ht 63.25 in | Wt 115.1 lb

## 2020-02-20 DIAGNOSIS — R05 Cough: Secondary | ICD-10-CM

## 2020-02-20 DIAGNOSIS — M5442 Lumbago with sciatica, left side: Secondary | ICD-10-CM

## 2020-02-20 DIAGNOSIS — R29898 Other symptoms and signs involving the musculoskeletal system: Secondary | ICD-10-CM

## 2020-02-20 DIAGNOSIS — G8929 Other chronic pain: Secondary | ICD-10-CM

## 2020-02-20 DIAGNOSIS — R1010 Upper abdominal pain, unspecified: Secondary | ICD-10-CM

## 2020-02-20 DIAGNOSIS — R42 Dizziness and giddiness: Secondary | ICD-10-CM | POA: Diagnosis not present

## 2020-02-20 DIAGNOSIS — G629 Polyneuropathy, unspecified: Secondary | ICD-10-CM

## 2020-02-20 DIAGNOSIS — R110 Nausea: Secondary | ICD-10-CM

## 2020-02-20 DIAGNOSIS — R053 Chronic cough: Secondary | ICD-10-CM

## 2020-02-20 LAB — RENAL FUNCTION PANEL
Albumin: 4.2 g/dL (ref 3.5–5.2)
BUN: 8 mg/dL (ref 6–23)
CO2: 28 mEq/L (ref 19–32)
Calcium: 9.6 mg/dL (ref 8.4–10.5)
Chloride: 100 mEq/L (ref 96–112)
Creatinine, Ser: 0.48 mg/dL (ref 0.40–1.20)
GFR: 126.24 mL/min (ref >60.00)
Glucose, Bld: 91 mg/dL (ref 70–99)
Phosphorus: 2.9 mg/dL (ref 2.3–4.6)
Potassium: 3.8 mEq/L (ref 3.5–5.1)
Sodium: 136 mEq/L (ref 135–145)

## 2020-02-20 LAB — CBC WITH DIFFERENTIAL/PLATELET
Basophils Absolute: 0 10*3/uL (ref 0.0–0.1)
Basophils Relative: 0.4 % (ref 0.0–3.0)
Eosinophils Absolute: 0.1 10*3/uL (ref 0.0–0.7)
Eosinophils Relative: 0.7 % (ref 0.0–5.0)
HCT: 42.2 % (ref 36.0–46.0)
Hemoglobin: 14.1 g/dL (ref 12.0–15.0)
Lymphocytes Relative: 22.8 % (ref 12.0–46.0)
Lymphs Abs: 2.2 10*3/uL (ref 0.7–4.0)
MCHC: 33.4 g/dL (ref 30.0–36.0)
MCV: 91.5 fl (ref 78.0–100.0)
Monocytes Absolute: 0.6 10*3/uL (ref 0.1–1.0)
Monocytes Relative: 6.2 % (ref 3.0–12.0)
Neutro Abs: 6.9 10*3/uL (ref 1.4–7.7)
Neutrophils Relative %: 69.9 % (ref 43.0–77.0)
Platelets: 313 10*3/uL (ref 150.0–400.0)
RBC: 4.61 Mil/uL (ref 3.87–5.11)
RDW: 13.2 % (ref 11.5–15.5)
WBC: 9.8 10*3/uL (ref 4.0–10.5)

## 2020-02-20 LAB — HEPATIC FUNCTION PANEL
ALT: 17 U/L (ref 0–35)
AST: 17 U/L (ref 0–37)
Albumin: 4.2 g/dL (ref 3.5–5.2)
Alkaline Phosphatase: 65 U/L (ref 39–117)
Bilirubin, Direct: 0.1 mg/dL (ref 0.0–0.3)
Total Bilirubin: 0.4 mg/dL (ref 0.2–1.2)
Total Protein: 7.1 g/dL (ref 6.0–8.3)

## 2020-02-20 LAB — AMYLASE: Amylase: 29 U/L (ref 27–131)

## 2020-02-20 MED ORDER — OMEPRAZOLE 20 MG PO CPDR: 20.0000 mg | DELAYED_RELEASE_CAPSULE | Freq: Two times a day (BID) | ORAL | 3 refills | Status: DC

## 2020-02-20 NOTE — Assessment & Plan Note (Signed)
Some imp with omeprazole 20 mg daily  Will inc to bid (also for poss gastritis) Pt thinks that stress may play a role Also hoarse/throat discomfort May ultimately need ENT visit

## 2020-02-20 NOTE — Assessment & Plan Note (Signed)
Pt is interested in neurologic w/u for this in the future once other health problems are figured out

## 2020-02-20 NOTE — Assessment & Plan Note (Signed)
With occ vomiting and epigastric pain/ loose stool on and off Rev ED note    Reviewed hospital records, lab results and studies in detail   Lab today  [place] abd ordered  Inc ppi to bid

## 2020-02-20 NOTE — Assessment & Plan Note (Signed)
Now L foot weakness Carol Chavez

## 2020-02-20 NOTE — Patient Instructions (Addendum)
Keep drinking fluids  Try and eat small amounts regularly   Labs today  Also please increase your omeprazole 20 mg to twice daily   Also I will order and ultrasound (abdomen)  Our office will call you to set this up   Use zofran for your nausea as planned   Stay away from anti inflammatory medicines    We may consider neurology and ENT visits in the future

## 2020-02-20 NOTE — Assessment & Plan Note (Signed)
Upper abd and ruq  Some nausea/occ vomiting and occ diarrhea  On omeprazole qd -will inc to bid in case of gastritis Lab today incl lft and cbc and amylase  [place] of abd also ordered to vis gallbladder and other organs  Pend results

## 2020-02-20 NOTE — Assessment & Plan Note (Signed)
Unsure at this point if this is assoc with nausea  Disc s/s of dehydration -she will make effort to get in more fluids  Pulse rate is high (but lower than when in ER)

## 2020-02-20 NOTE — Progress Notes (Signed)
Subjective:    Patient ID: Carol Chavez, female    DOB: 22-Jan-1946, 74 y.o.   MRN: [id number]  This visit occurred during the SARS-CoV-2 public health emergency.  Safety protocols were in place, including screening questions prior to the visit, additional usage of staff PPE, and extensive cleaning of exam room while observing appropriate contact time as indicated for disinfecting solutions.    HPI Pt presents with n/v/d and dizziness and multiple other symptoms  Also high stress level   Wt Readings from Last 3 Encounters:  02/20/20 115 lb 1 oz (52.2 kg)  02/08/20 118 lb (53.5 kg)  02/08/20 118 lb (53.5 kg)   20.22 kg/m  Feels weaker because of wt loss and eating less (due to nausea and stress)    BP Readings from Last 3 Encounters:  02/20/20 136/72  02/08/20 110/68  02/08/20 124/64   Pulse Readings from Last 3 Encounters:  02/20/20 (!) 109  02/08/20 94  02/08/20 100   Pt feels anxious today  Has had fluids today  Tested for covid after the symptoms started and is negative on 3/4  She had her covid vaccine 13 days ago  Vomiting started the Saturday before 2nd covid vaccine (given on the following Tuesday) She was seen in the ED on 3/4 and given fluids Has zofran   No sick exposures  Last thing she ate was eggs benedict   (husband ate it and did not get sick)  No food allergies   Nausea comes and goes  Vomits occ- last Wednesday evening was the last time  (after a massage and ate burger and fries afterwards)- not her usual   She has loose stool when she vomits (watery)  Other than that is loose or normal  No black stool No blood in her stool   abd pain is in epigastric area- fairly mild / on and off  Dull ache/not burning   Had heartburn one night  Still some hoarseness and funny feeling in throat  Cough (chronic) is improved but not gone  She is taking omeprazole   Eating does not make symptoms worse  Appetite is down  Is able to drink lots of fluids  and stays hydrated     Lab Results  Component Value Date   CREATININE 0.64 02/08/2020   BUN 11 02/08/2020   NA 137 02/08/2020   K 4.1 02/08/2020   CL 101 02/08/2020   CO2 25 02/08/2020   Lab Results  Component Value Date   ALT 26 02/08/2020   AST 22 02/08/2020   ALKPHOS 57 02/08/2020   BILITOT 0.6 02/08/2020    Lab Results  Component Value Date   WBC 7.6 02/08/2020   HGB 14.5 02/08/2020   HCT 43.6 02/08/2020   MCV 91.8 02/08/2020   PLT 254 02/08/2020   Has zofran at home and it helps   Has pinched nerve in back - MRI upcoming Decreased ability to lift L foot Sees Dr Prince Rome   Also neuopathy both feet   Lots of stress lately  Also loss   Patient Active Problem List   Diagnosis Date Noted  . Upper abdominal pain 02/20/2020  . Dizziness 02/20/2020  . Neuropathy 02/20/2020  . Weakness of foot, left 02/20/2020  . Nausea 02/20/2020  . Chronic cough 12/20/2019  . Chronic bilateral low back pain with left-sided sciatica 12/20/2019  . Routine general medical examination at a health care facility 07/27/2016  . Hyponatremia 04/26/2016  . Prediabetes 04/26/2016  .  Encounter for Medicare annual wellness exam 06/13/2013  . Colon cancer screening 06/13/2013  . Hyperlipidemia 07/25/2009  . VAGINITIS, ATROPHIC, POSTMENOPAUSAL 07/25/2009   History reviewed. No pertinent past medical history. History reviewed. No pertinent surgical history. Social History   Tobacco Use  . Smoking status: Never Smoker  . Smokeless tobacco: Never Used  Substance Use Topics  . Alcohol use: Yes    Comment: occ  . Drug use: No   History reviewed. No pertinent family history. No Known Allergies Current Outpatient Medications on File Prior to Visit  Medication Sig Dispense Refill  . Cholecalciferol (VITAMIN D PO) Take 1 capsule by mouth daily.     . diazepam (VALIUM) 5 MG tablet 1 PO 1 hour before MRI, repeat prn 5 tablet 0  . estradiol (ESTRACE VAGINAL) 0.1 MG/GM vaginal cream Use a  small amount (1-2 cm) intravaginally twice weekly 42.5 g 5  . gabapentin (NEURONTIN) 100 MG capsule 1 PO q HS, may increase to 3 PO q HS if needed/tolerated 90 capsule 3  . LUTEIN PO Take 1 capsule by mouth daily.     Marland Kitchen MAGNESIUM PO Take 2 tablets by mouth 2 (two) times daily.     . Multiple Vitamin (MULTIVITAMIN) capsule Take 1 capsule by mouth daily.    . ondansetron (ZOFRAN ODT) 4 MG disintegrating tablet Take 1 tablet (4 mg total) by mouth every 8 (eight) hours as needed for nausea or vomiting. 12 tablet 0   No current facility-administered medications on file prior to visit.    Review of Systems  Constitutional: Positive for fatigue. Negative for activity change, appetite change, fever and unexpected weight change.       Wt loss from decreased po intake  HENT: Negative for congestion, ear pain, rhinorrhea, sinus pressure and sore throat.   Eyes: Negative for pain, redness and visual disturbance.  Respiratory: Positive for cough. Negative for choking, chest tightness, shortness of breath and wheezing.   Cardiovascular: Negative for chest pain, palpitations and leg swelling.       HR goes up when anxious or dehydrated  Gastrointestinal: Positive for abdominal pain, diarrhea, nausea and vomiting. Negative for abdominal distention, anal bleeding, blood in stool, constipation and rectal pain.  Endocrine: Negative for polydipsia and polyuria.  Genitourinary: Negative for dysuria, frequency, pelvic pain and urgency.  Musculoskeletal: Positive for back pain. Negative for arthralgias and myalgias.  Skin: Negative for pallor and rash.  Allergic/Immunologic: Negative for environmental allergies.  Neurological: Positive for weakness and numbness. Negative for dizziness, syncope and headaches.       Some numbness/weakness of L foot -possibly related to lumbar problem   Also bilat neuropathy in feet causing burning   Hematological: Negative for adenopathy. Does not bruise/bleed easily.       Very  stressed  Psychiatric/Behavioral: Negative for decreased concentration and dysphoric mood. The patient is nervous/anxious.        Objective:   Physical Exam Constitutional:      General: She is not in acute distress.    Appearance: She is well-developed and normal weight. She is not ill-appearing or diaphoretic.  HENT:     Head: Normocephalic and atraumatic.     Right Ear: Tympanic membrane, ear canal and external ear normal.     Left Ear: Tympanic membrane, ear canal and external ear normal.     Nose: Nose normal.     Mouth/Throat:     Mouth: Mucous membranes are moist.  Eyes:     General: No scleral  icterus.       Right eye: No discharge.        Left eye: No discharge.     Conjunctiva/sclera: Conjunctivae normal.     Pupils: Pupils are equal, round, and reactive to light.     Comments: No nystagmus (also not dizzy today)  Neck:     Thyroid: No thyromegaly.     Vascular: No carotid bruit or JVD.  Cardiovascular:     Rate and Rhythm: Regular rhythm. Tachycardia present.     Pulses: Normal pulses.     Heart sounds: Normal heart sounds. No gallop.   Pulmonary:     Effort: Pulmonary effort is normal. No respiratory distress.     Breath sounds: Normal breath sounds. No wheezing or rales.  Abdominal:     General: Abdomen is flat. Bowel sounds are normal. There is no distension or abdominal bruit.     Palpations: Abdomen is soft. There is no hepatomegaly, splenomegaly, mass or pulsatile mass.     Tenderness: There is abdominal tenderness in the right upper quadrant and epigastric area. There is no right CVA tenderness, left CVA tenderness, guarding or rebound. Negative signs include Murphy's sign and McBurney's sign.     Hernia: No hernia is present.  Musculoskeletal:        General: No tenderness.     Cervical back: Normal range of motion and neck supple.     Right lower leg: No edema.     Left lower leg: No edema.  Lymphadenopathy:     Cervical: No cervical adenopathy.   Skin:    General: Skin is warm and dry.     Coloration: Skin is not jaundiced or pale.     Findings: No erythema or rash.  Neurological:     Mental Status: She is alert.     Cranial Nerves: No cranial nerve deficit.     Sensory: No sensory deficit.     Motor: Weakness present.     Deep Tendon Reflexes: Reflexes are normal and symmetric.     Comments: Decreased strength- L foot great toe dorsiflexion    Psychiatric:        Mood and Affect: Mood and affect normal.        Cognition and Memory: Cognition and memory normal.     Comments: Seems stressed and fatigued Candidly discusses stressors            Assessment & Plan:   Problem List Items Addressed This Visit      Nervous and Auditory   Chronic bilateral low back pain with left-sided sciatica    Now L foot weakness /numbness      Neuropathy    Pt is interested in neurologic w/u for this in the future once other health problems are figured out         Other   Chronic cough    Some imp with omeprazole 20 mg daily  Will inc to bid (also for poss gastritis) Pt thinks that stress may play a role Also hoarse/throat discomfort May ultimately need ENT visit       Upper abdominal pain - Primary    Upper abd and ruq  Some nausea/occ vomiting and occ diarrhea  On omeprazole qd -will inc to bid in case of gastritis Lab today incl lft and cbc and amylase  [place] of abd also ordered to vis gallbladder and other organs  Pend results       Relevant Orders   US Abdomen Complete  CBC with Differential/Platelet   Renal function panel   Hepatic function panel   Amylase   Dizziness    Unsure at this point if this is assoc with nausea  Disc s/s of dehydration -she will make effort to get in more fluids  Pulse rate is high (but lower than when in ER)        Weakness of foot, left    Problems with great toe dorsiflexion (not full foot drop) Suspect due to LS process  Also feels cold/numb in that foot (although she has  neuropathy as well)  For MRI soon Urged her to contact her orthopedic to let them know symptoms have worsened      Nausea    With occ vomiting and epigastric pain/ loose stool on and off Rev ED note    Reviewed hospital records, lab results and studies in detail   Lab today  [place] abd ordered  Inc ppi to bid

## 2020-02-20 NOTE — Assessment & Plan Note (Signed)
Problems with great toe dorsiflexion (not full foot drop) Suspect due to LS process  Also feels cold/numb in that foot (although she has neuropathy as well)  For MRI soon Urged her to contact her orthopedic to let them know symptoms have worsened

## 2020-02-22 ENCOUNTER — Telehealth: Payer: Self-pay | Admitting: Family Medicine

## 2020-02-22 ENCOUNTER — Ambulatory Visit
Admission: RE | Admit: 2020-02-22 | Discharge: 2020-02-22 | Disposition: A | Discharge status: Home / Self Care | Payer: Medicare Other | Source: Ambulatory Visit | Attending: Family Medicine | Admitting: Family Medicine

## 2020-02-22 DIAGNOSIS — D7389 Other diseases of spleen: Secondary | ICD-10-CM | POA: Insufficient documentation

## 2020-02-22 DIAGNOSIS — K769 Liver disease, unspecified: Secondary | ICD-10-CM | POA: Insufficient documentation

## 2020-02-22 DIAGNOSIS — R1010 Upper abdominal pain, unspecified: Secondary | ICD-10-CM | POA: Diagnosis not present

## 2020-02-22 NOTE — Telephone Encounter (Signed)
-----   Message from Shon Millet, [place] sent at 02/22/2020 10:32 AM EDT ----- Pt notified of [place] results and she also viewed them on mychart. Pt said she already has a MRI scheduled for her Lumbar spin on 03/02/20, she is asking if we could do the MRI of her abd with the MRI of her Lumbar spine so she doesn't have to have 2 separate imaging done. Pt advised I would check with PCP and [facility] and our [facility] will call her back

## 2020-02-22 NOTE — Telephone Encounter (Signed)
Order done  Sending to [facility]

## 2020-02-22 NOTE — Telephone Encounter (Signed)
Addressed through result notes  

## 2020-02-22 NOTE — Telephone Encounter (Signed)
Spoke with patient and she will call [facility] to schedule both MRI's on the same day. [facility] has to talk directly to the patient to scheudle.

## 2020-02-22 NOTE — Telephone Encounter (Signed)
Advised on report - will need addnl imaging  See comments

## 2020-02-22 NOTE — Telephone Encounter (Signed)
Stacy with [facility] calling with a call report on the U/[place] Abd.  There were several abnormal findings on the ultrasound.   Please advise, thanks.

## 2020-02-28 DIAGNOSIS — M9905 Segmental and somatic dysfunction of pelvic region: Secondary | ICD-10-CM | POA: Diagnosis not present

## 2020-02-28 DIAGNOSIS — M5136 Other intervertebral disc degeneration, lumbar region: Secondary | ICD-10-CM | POA: Diagnosis not present

## 2020-02-28 DIAGNOSIS — M9904 Segmental and somatic dysfunction of sacral region: Secondary | ICD-10-CM | POA: Diagnosis not present

## 2020-02-28 DIAGNOSIS — M9903 Segmental and somatic dysfunction of lumbar region: Secondary | ICD-10-CM | POA: Diagnosis not present

## 2020-02-29 DIAGNOSIS — M9905 Segmental and somatic dysfunction of pelvic region: Secondary | ICD-10-CM | POA: Diagnosis not present

## 2020-02-29 DIAGNOSIS — M9903 Segmental and somatic dysfunction of lumbar region: Secondary | ICD-10-CM | POA: Diagnosis not present

## 2020-02-29 DIAGNOSIS — M9904 Segmental and somatic dysfunction of sacral region: Secondary | ICD-10-CM | POA: Diagnosis not present

## 2020-02-29 DIAGNOSIS — M5136 Other intervertebral disc degeneration, lumbar region: Secondary | ICD-10-CM | POA: Diagnosis not present

## 2020-03-02 ENCOUNTER — Ambulatory Visit
Admission: RE | Admit: 2020-03-02 | Discharge: 2020-03-02 | Disposition: A | Discharge status: Home / Self Care | Payer: Medicare Other | Source: Ambulatory Visit | Attending: Family Medicine | Admitting: Family Medicine

## 2020-03-02 DIAGNOSIS — G8929 Other chronic pain: Secondary | ICD-10-CM

## 2020-03-02 DIAGNOSIS — M48061 Spinal stenosis, lumbar region without neurogenic claudication: Secondary | ICD-10-CM | POA: Diagnosis not present

## 2020-03-04 ENCOUNTER — Telehealth: Payer: Self-pay | Admitting: Family Medicine

## 2020-03-04 DIAGNOSIS — G8929 Other chronic pain: Secondary | ICD-10-CM

## 2020-03-04 DIAGNOSIS — M9905 Segmental and somatic dysfunction of pelvic region: Secondary | ICD-10-CM | POA: Diagnosis not present

## 2020-03-04 DIAGNOSIS — M9904 Segmental and somatic dysfunction of sacral region: Secondary | ICD-10-CM | POA: Diagnosis not present

## 2020-03-04 DIAGNOSIS — M9903 Segmental and somatic dysfunction of lumbar region: Secondary | ICD-10-CM | POA: Diagnosis not present

## 2020-03-04 DIAGNOSIS — M5136 Other intervertebral disc degeneration, lumbar region: Secondary | ICD-10-CM | POA: Diagnosis not present

## 2020-03-04 DIAGNOSIS — M545 Low back pain, unspecified: Secondary | ICD-10-CM

## 2020-03-04 NOTE — Telephone Encounter (Signed)
MRI shows several disc bulges, and some arthritis in the facet joints.  No nerve compression seen, but the findings could certainly cause pain.  No clear-cut indication for surgery based on the results.  If pain is no better after physical therapy, could contemplate a trial of chiropractic, or possibly referral to Dr. Alvester Morin for spine injections.

## 2020-03-06 DIAGNOSIS — M9904 Segmental and somatic dysfunction of sacral region: Secondary | ICD-10-CM | POA: Diagnosis not present

## 2020-03-06 DIAGNOSIS — M5136 Other intervertebral disc degeneration, lumbar region: Secondary | ICD-10-CM | POA: Diagnosis not present

## 2020-03-06 DIAGNOSIS — M9905 Segmental and somatic dysfunction of pelvic region: Secondary | ICD-10-CM | POA: Diagnosis not present

## 2020-03-06 DIAGNOSIS — M9903 Segmental and somatic dysfunction of lumbar region: Secondary | ICD-10-CM | POA: Diagnosis not present

## 2020-03-11 DIAGNOSIS — M9903 Segmental and somatic dysfunction of lumbar region: Secondary | ICD-10-CM | POA: Diagnosis not present

## 2020-03-11 DIAGNOSIS — M5136 Other intervertebral disc degeneration, lumbar region: Secondary | ICD-10-CM | POA: Diagnosis not present

## 2020-03-11 DIAGNOSIS — M9904 Segmental and somatic dysfunction of sacral region: Secondary | ICD-10-CM | POA: Diagnosis not present

## 2020-03-11 DIAGNOSIS — M9905 Segmental and somatic dysfunction of pelvic region: Secondary | ICD-10-CM | POA: Diagnosis not present

## 2020-03-11 NOTE — Addendum Note (Signed)
Addended by: Lillia Carmel on: 03/11/2020 04:12 PM   Modules accepted: Orders

## 2020-03-12 DIAGNOSIS — M9904 Segmental and somatic dysfunction of sacral region: Secondary | ICD-10-CM | POA: Diagnosis not present

## 2020-03-12 DIAGNOSIS — M9905 Segmental and somatic dysfunction of pelvic region: Secondary | ICD-10-CM | POA: Diagnosis not present

## 2020-03-12 DIAGNOSIS — M5136 Other intervertebral disc degeneration, lumbar region: Secondary | ICD-10-CM | POA: Diagnosis not present

## 2020-03-12 DIAGNOSIS — M9903 Segmental and somatic dysfunction of lumbar region: Secondary | ICD-10-CM | POA: Diagnosis not present

## 2020-03-14 DIAGNOSIS — M5136 Other intervertebral disc degeneration, lumbar region: Secondary | ICD-10-CM | POA: Diagnosis not present

## 2020-03-14 DIAGNOSIS — M9905 Segmental and somatic dysfunction of pelvic region: Secondary | ICD-10-CM | POA: Diagnosis not present

## 2020-03-14 DIAGNOSIS — M9903 Segmental and somatic dysfunction of lumbar region: Secondary | ICD-10-CM | POA: Diagnosis not present

## 2020-03-14 DIAGNOSIS — M9904 Segmental and somatic dysfunction of sacral region: Secondary | ICD-10-CM | POA: Diagnosis not present

## 2020-03-18 DIAGNOSIS — M5136 Other intervertebral disc degeneration, lumbar region: Secondary | ICD-10-CM | POA: Diagnosis not present

## 2020-03-18 DIAGNOSIS — M9903 Segmental and somatic dysfunction of lumbar region: Secondary | ICD-10-CM | POA: Diagnosis not present

## 2020-03-18 DIAGNOSIS — M9905 Segmental and somatic dysfunction of pelvic region: Secondary | ICD-10-CM | POA: Diagnosis not present

## 2020-03-18 DIAGNOSIS — M9904 Segmental and somatic dysfunction of sacral region: Secondary | ICD-10-CM | POA: Diagnosis not present

## 2020-03-20 ENCOUNTER — Encounter (INDEPENDENT_AMBULATORY_CARE_PROVIDER_SITE_OTHER): Payer: Self-pay | Admitting: Otolaryngology

## 2020-03-20 ENCOUNTER — Ambulatory Visit (INDEPENDENT_AMBULATORY_CARE_PROVIDER_SITE_OTHER): Payer: Medicare Other | Admitting: Otolaryngology

## 2020-03-20 ENCOUNTER — Other Ambulatory Visit: Payer: Self-pay

## 2020-03-20 VITALS — Temp 98.2°F

## 2020-03-20 DIAGNOSIS — K219 Gastro-esophageal reflux disease without esophagitis: Secondary | ICD-10-CM | POA: Diagnosis not present

## 2020-03-20 DIAGNOSIS — M5136 Other intervertebral disc degeneration, lumbar region: Secondary | ICD-10-CM | POA: Diagnosis not present

## 2020-03-20 DIAGNOSIS — M9905 Segmental and somatic dysfunction of pelvic region: Secondary | ICD-10-CM | POA: Diagnosis not present

## 2020-03-20 DIAGNOSIS — M9904 Segmental and somatic dysfunction of sacral region: Secondary | ICD-10-CM | POA: Diagnosis not present

## 2020-03-20 DIAGNOSIS — R490 Dysphonia: Secondary | ICD-10-CM

## 2020-03-20 DIAGNOSIS — M9903 Segmental and somatic dysfunction of lumbar region: Secondary | ICD-10-CM | POA: Diagnosis not present

## 2020-03-20 NOTE — Progress Notes (Signed)
HPI: Carol Chavez is a 74 y.o. female who presents for evaluation of hoarseness.  Last October patient developed a dry cough and a lot of throat clearing.  She was initially started on omeprazole 20 mg daily.  She was subsequently increased to omeprazole 20 mg twice daily and the cough became much better.  However she has also had a raspy voice during the same.  And her voice is still raspy.  She also complains of some thick mucus and occasionally uses Mucinex. She does not smoke. Denies sore throat or trouble swallowing.  No past medical history on file. No past surgical history on file. Social History   Socioeconomic History  . Marital status: Married    Spouse name: Not on file  . Number of children: Not on file  . Years of education: Not on file  . Highest education level: Not on file  Occupational History  . Not on file  Tobacco Use  . Smoking status: Never Smoker  . Smokeless tobacco: Never Used  Substance and Sexual Activity  . Alcohol use: Yes    Comment: occ  . Drug use: No  . Sexual activity: Never  Other Topics Concern  . Not on file  Social History Narrative  . Not on file   Social Determinants of Health   Financial Resource Strain:   . Difficulty of Paying Living Expenses:   Food Insecurity:   . Worried About Charity fundraiser in the Last Year:   . Arboriculturist in the Last Year:   Transportation Needs:   . Film/video editor (Medical):   Marland Kitchen Lack of Transportation (Non-Medical):   Physical Activity:   . Days of Exercise per Week:   . Minutes of Exercise per Session:   Stress:   . Feeling of Stress :   Social Connections:   . Frequency of Communication with Friends and Family:   . Frequency of Social Gatherings with Friends and Family:   . Attends Religious Services:   . Active Member of Clubs or Organizations:   . Attends Archivist Meetings:   Marland Kitchen Marital Status:    No family history on file. No Known Allergies Prior to Admission  medications   Medication Sig Start Date End Date Taking? Authorizing Provider  Cholecalciferol (VITAMIN D PO) Take 1 capsule by mouth daily.    Yes [provider]  diazepam (VALIUM) 5 MG tablet 1 PO 1 hour before MRI, repeat prn 01/13/20  Yes Hilts, Legrand Como, MD  estradiol (ESTRACE VAGINAL) 0.1 MG/GM vaginal cream Use a small amount (1-2 cm) intravaginally twice weekly 10/05/19  Yes Tower, Wynelle Fanny, MD  gabapentin (NEURONTIN) 100 MG capsule 1 PO q HS, may increase to 3 PO q HS if needed/tolerated 11/27/19  Yes Hilts, Michael, MD  LUTEIN PO Take 1 capsule by mouth daily.    Yes [provider]  MAGNESIUM PO Take 2 tablets by mouth 2 (two) times daily.    Yes [provider]  Multiple Vitamin (MULTIVITAMIN) capsule Take 1 capsule by mouth daily.   Yes [provider]  omeprazole (PRILOSEC) 20 MG capsule Take 1 capsule (20 mg total) by mouth 2 (two) times daily before a meal. 02/20/20  Yes Tower, Wynelle Fanny, MD  ondansetron (ZOFRAN ODT) 4 MG disintegrating tablet Take 1 tablet (4 mg total) by mouth every 8 (eight) hours as needed for nausea or vomiting. 02/08/20  Yes Tegeler, Gwenyth Allegra, MD     Positive ROS:  Otherwise negative  All other systems have been reviewed and were otherwise negative with the exception of those mentioned in the HPI and as above.  Physical Exam: Constitutional: Alert, well-appearing, no acute distress.  She has minimal hoarseness in the office today with a very slight crackling of her voice. Ears: External ears without lesions or tenderness. Ear canals are clear bilaterally with intact, clear TMs.  Nasal: External nose without lesions. Septum midline with mild rhinitis.  Both middle meatus regions were clear with no signs of infection. Oral: Lips and gums without lesions. Tongue and palate mucosa without lesions. Posterior oropharynx clear. Fiberoptic laryngoscopy was performed to the right nostril.  The nasopharynx was clear.  Base of tongue  vallecula and epiglottis were normal.  On evaluation of vocal cords she had very slight edema of the vocal cords with mild mucus that cleared easily with coughing.  There were no mucosal lesions noted and both cords had normal mobility bilaterally. Neck: No palpable adenopathy or masses Respiratory: Breathing comfortably  Skin: No facial/neck lesions or rash noted.  Laryngoscopy  Date/Time: 03/20/2020 12:33 PM Performed by: Drema Halon, MD Authorized by: Drema Halon, MD   Consent:    Consent obtained:  Verbal   Consent given by:  Patient Procedure details:    Indications: hoarseness, dysphagia, or aspiration     Medication:  Afrin   Scope location: right nare   Mouth:    Vallecula: normal     Base of tongue: normal     Epiglottis: normal   Throat:    True vocal cords: normal   Comments:     Vocal cords were clear bilaterally with normal vocal cord mobility.  Had minimal posterior edema most likely related to laryngeal pharyngeal reflux.  Minimal mucus within the supraglottis.    Assessment: Hoarseness secondary to laryngeal pharyngeal reflux.  Plan: Reassured patient of normal laryngeal examination with no evidence of neoplasm or abnormality. Would recommend use of omeprazole once a day before dinner.  Also discussed with her concerning drinking plenty liquids as this will help clear the mucus as well as make it thinner.  [name], MD

## 2020-03-22 ENCOUNTER — Telehealth: Payer: Self-pay | Admitting: Family Medicine

## 2020-03-22 DIAGNOSIS — G8929 Other chronic pain: Secondary | ICD-10-CM

## 2020-03-22 MED ORDER — DIAZEPAM 5 MG PO TABS: ORAL_TABLET | ORAL | 0 refills | Status: DC

## 2020-03-22 NOTE — Telephone Encounter (Signed)
I called to get an idea of what he wanted to discuss with you. He is calling regarding the "impingement" that she has, affecting her left leg. She is seeing Dr. Glendale Chard and going to PT, as instructed. He would like to know if there is anything else that could/should be done to see if the impingement is in the leg/lower leg vs in the back. He did say he was not sure what her Lsp MRI showed exactly. His point is that he does not want her condition to worsen more because something was missed. I think he would still like a call from you?

## 2020-03-22 NOTE — Telephone Encounter (Signed)
Patient's husband called requesting to speak to Dr. Prince Rome.  His [phone].  Thank you.

## 2020-03-22 NOTE — Telephone Encounter (Signed)
I think we should proceed with lumbar MRI.  I have ordered an open MRI with valium.

## 2020-03-23 ENCOUNTER — Ambulatory Visit
Admission: RE | Admit: 2020-03-23 | Discharge: 2020-03-23 | Disposition: A | Discharge status: Home / Self Care | Payer: Medicare Other | Source: Ambulatory Visit | Attending: Family Medicine | Admitting: Family Medicine

## 2020-03-23 DIAGNOSIS — K769 Liver disease, unspecified: Secondary | ICD-10-CM

## 2020-03-23 DIAGNOSIS — R1010 Upper abdominal pain, unspecified: Secondary | ICD-10-CM | POA: Diagnosis not present

## 2020-03-23 MED ORDER — GADOBENATE DIMEGLUMINE 529 MG/ML IV SOLN
10.0000 mL | Freq: Once | INTRAVENOUS | Status: AC | PRN
  Administered 2020-03-23: 10 mL via INTRAVENOUS

## 2020-03-25 ENCOUNTER — Other Ambulatory Visit: Payer: Self-pay | Admitting: Family Medicine

## 2020-03-25 DIAGNOSIS — M5136 Other intervertebral disc degeneration, lumbar region: Secondary | ICD-10-CM | POA: Diagnosis not present

## 2020-03-25 DIAGNOSIS — R29898 Other symptoms and signs involving the musculoskeletal system: Secondary | ICD-10-CM

## 2020-03-25 DIAGNOSIS — M9903 Segmental and somatic dysfunction of lumbar region: Secondary | ICD-10-CM | POA: Diagnosis not present

## 2020-03-25 DIAGNOSIS — M9904 Segmental and somatic dysfunction of sacral region: Secondary | ICD-10-CM | POA: Diagnosis not present

## 2020-03-25 DIAGNOSIS — M9905 Segmental and somatic dysfunction of pelvic region: Secondary | ICD-10-CM | POA: Diagnosis not present

## 2020-03-25 NOTE — Telephone Encounter (Signed)
She already has an appointment with Dr.Newton on 04/04/20 for L3-4, L4-5 facet block.

## 2020-03-25 NOTE — Progress Notes (Signed)
Patient's husband states her pain is slowly getting better with PT and chiro.  Also scheduled for lumbar injections soon.  But still having left leg weakness.  Will order nerve studies to evaluate.

## 2020-03-25 NOTE — Telephone Encounter (Signed)
She just had one 02/25/20. Is she to have another?

## 2020-03-25 NOTE — Telephone Encounter (Signed)
Sorry, my mistake.  The impingement is coming from the spine.  Another approach would be referral to Dr. Alvester Morin for an injection.

## 2020-03-28 DIAGNOSIS — M9904 Segmental and somatic dysfunction of sacral region: Secondary | ICD-10-CM | POA: Diagnosis not present

## 2020-03-28 DIAGNOSIS — M9905 Segmental and somatic dysfunction of pelvic region: Secondary | ICD-10-CM | POA: Diagnosis not present

## 2020-03-28 DIAGNOSIS — M5136 Other intervertebral disc degeneration, lumbar region: Secondary | ICD-10-CM | POA: Diagnosis not present

## 2020-03-28 DIAGNOSIS — M9903 Segmental and somatic dysfunction of lumbar region: Secondary | ICD-10-CM | POA: Diagnosis not present

## 2020-04-01 DIAGNOSIS — M9905 Segmental and somatic dysfunction of pelvic region: Secondary | ICD-10-CM | POA: Diagnosis not present

## 2020-04-01 DIAGNOSIS — M5136 Other intervertebral disc degeneration, lumbar region: Secondary | ICD-10-CM | POA: Diagnosis not present

## 2020-04-01 DIAGNOSIS — M9903 Segmental and somatic dysfunction of lumbar region: Secondary | ICD-10-CM | POA: Diagnosis not present

## 2020-04-01 DIAGNOSIS — M9904 Segmental and somatic dysfunction of sacral region: Secondary | ICD-10-CM | POA: Diagnosis not present

## 2020-04-02 ENCOUNTER — Telehealth: Payer: Self-pay | Admitting: Family Medicine

## 2020-04-02 DIAGNOSIS — G629 Polyneuropathy, unspecified: Secondary | ICD-10-CM

## 2020-04-02 DIAGNOSIS — R251 Tremor, unspecified: Secondary | ICD-10-CM

## 2020-04-02 NOTE — Telephone Encounter (Signed)
Received a call from patient directly requesting a Neurology Referral. She has neuropathy in her feet and she also feels like she shakes sometimes. She is requesting to go to [facility] in [place]. She is aware that they will call her to schedule.

## 2020-04-02 NOTE — Telephone Encounter (Signed)
Referral done Please let her know she will get a call in the next week or two to set this up  I will also route to [facility]

## 2020-04-03 DIAGNOSIS — M9905 Segmental and somatic dysfunction of pelvic region: Secondary | ICD-10-CM | POA: Diagnosis not present

## 2020-04-03 DIAGNOSIS — M9904 Segmental and somatic dysfunction of sacral region: Secondary | ICD-10-CM | POA: Diagnosis not present

## 2020-04-03 DIAGNOSIS — M5136 Other intervertebral disc degeneration, lumbar region: Secondary | ICD-10-CM | POA: Diagnosis not present

## 2020-04-03 DIAGNOSIS — M9903 Segmental and somatic dysfunction of lumbar region: Secondary | ICD-10-CM | POA: Diagnosis not present

## 2020-04-04 ENCOUNTER — Ambulatory Visit: Payer: Medicare Other | Admitting: Physical Medicine and Rehabilitation

## 2020-04-04 ENCOUNTER — Other Ambulatory Visit: Payer: Self-pay

## 2020-04-04 ENCOUNTER — Encounter: Payer: Self-pay | Admitting: Physical Medicine and Rehabilitation

## 2020-04-04 ENCOUNTER — Ambulatory Visit: Payer: Self-pay

## 2020-04-04 VITALS — BP 134/80 | HR 105

## 2020-04-04 DIAGNOSIS — M47816 Spondylosis without myelopathy or radiculopathy, lumbar region: Secondary | ICD-10-CM

## 2020-04-04 MED ORDER — METHYLPREDNISOLONE ACETATE 80 MG/ML IJ SUSP
40.0000 mg | Freq: Once | INTRAMUSCULAR | Status: AC
  Administered 2020-04-04: 40 mg

## 2020-04-04 NOTE — Progress Notes (Signed)
Numeric Pain Rating Scale and Functional Assessment Average Pain 0   In the last MONTH (on 0-10 scale) has pain interfered with the following?  1. General activity like being  able to carry out your everyday physical activities such as walking, climbing stairs, carrying groceries, or moving a chair?  Rating(9)   +Driver, -BT, -Dye Allergies.

## 2020-04-05 NOTE — Procedures (Signed)
Lumbar Facet Joint Intra-Articular Injection(s) with Fluoroscopic Guidance  Patient: Carol Chavez      Date of Birth: 1946/03/31 MRN: [id number] PCP: Judy Pimple, MD      Visit Date: 04/04/2020   Universal Protocol:    Date/Time: 04/04/2020  Consent Given By: the patient  Position: PRONE   Additional Comments: Vital signs were monitored before and after the procedure. Patient was prepped and draped in the usual sterile fashion. The correct patient, procedure, and site was verified.   Injection Procedure Details:  Procedure Site One Meds Administered:  Meds ordered this encounter  Medications  . methylPREDNISolone acetate (DEPO-MEDROL) injection 40 mg     Laterality: Left  Location/Site:  L3-L4 L4-L5  Needle size: 22 guage  Needle type: Spinal  Needle Placement: Articular  Findings:  -Comments: Excellent flow of contrast producing a partial arthrogram.  Procedure Details: The fluoroscope beam is vertically oriented in AP, and the inferior recess is visualized beneath the lower pole of the inferior apophyseal process, which represents the target point for needle insertion. When direct visualization is difficult the target point is located at the medial projection of the vertebral pedicle. The region overlying each aforementioned target is locally anesthetized with a 1 to 2 ml. volume of 1% Lidocaine without Epinephrine.   The spinal needle was inserted into each of the above mentioned facet joints using biplanar fluoroscopic guidance. A 0.25 to 0.5 ml. volume of Isovue-250 was injected and a partial facet joint arthrogram was obtained. A single spot film was obtained of the resulting arthrogram.    One to 1.25 ml of the steroid/anesthetic solution was then injected into each of the facet joints noted above.   Additional Comments:  The patient tolerated the procedure well Dressing: 2 x 2 sterile gauze and Band-Aid    Post-procedure details: Patient was observed  during the procedure. Post-procedure instructions were reviewed.  Patient left the clinic in stable condition.

## 2020-04-05 NOTE — Progress Notes (Signed)
Carol Chavez - 74 y.o. female MRN [id number]  Date of birth: Feb 13, 1946  Office Visit Note: Visit Date: 04/04/2020 PCP: Judy Pimple, MD Referred by: Tower, Audrie Gallus, MD  Subjective: Chief Complaint  Patient presents with  . Spine - Pain   HPI:  ADIYA Chavez is a 74 y.o. female who comes in today For planned left facet joint block at L3-4 and L4-5 for left low back and hip pain.  Her hip pain is intermittent with activity and standing.  She gets her some referral into the hamstring at times.  Her biggest complaint however is numbness or a feeling of heaviness in the left foot.  She has been followed by Dr. Prince Rome.  She has a referral placed to neurology for electrodiagnostic study of the left lower limb.  MRI of the lumbar spine does show disc herniation higher up at around L3.  This is on the left side and does affect the lateral recess.  She also has significant facet arthropathy.  Plan today is diagnostic facet joint blocks followed in 2 weeks or so by potential for epidural injection either at the level where the disc is or symptomatically potentially at S1.  I think the electrodiagnostic study at this point would be paramount to see if there is any nerve damage.  She does have atrophy of the left lower limb.  She has some discoloration.  I am not sure if she meets all the criteria for complex regional pain syndrome.  ROS Otherwise per HPI.  Assessment & Plan: Visit Diagnoses:  1. Spondylosis without myelopathy or radiculopathy, lumbar region     Plan: No additional findings.   Meds & Orders:  Meds ordered this encounter  Medications  . methylPREDNISolone acetate (DEPO-MEDROL) injection 40 mg    Orders Placed This Encounter  Procedures  . Facet Injection  . XR C-ARM NO REPORT    Follow-up: Return in about 2 weeks (around 04/18/2020) for Epidural injection.   Procedures: No procedures performed  Lumbar Facet Joint Intra-Articular Injection(s) with Fluoroscopic  Guidance  Patient: Carol Chavez      Date of Birth: [birth year] MRN: [id number] PCP: Judy Pimple, MD      Visit Date: 04/04/2020   Universal Protocol:    Date/Time: 04/04/2020  Consent Given By: the patient  Position: PRONE   Additional Comments: Vital signs were monitored before and after the procedure. Patient was prepped and draped in the usual sterile fashion. The correct patient, procedure, and site was verified.   Injection Procedure Details:  Procedure Site One Meds Administered:  Meds ordered this encounter  Medications  . methylPREDNISolone acetate (DEPO-MEDROL) injection 40 mg     Laterality: Left  Location/Site:  L3-L4 L4-L5  Needle size: 22 guage  Needle type: Spinal  Needle Placement: Articular  Findings:  -Comments: Excellent flow of contrast producing a partial arthrogram.  Procedure Details: The fluoroscope beam is vertically oriented in AP, and the inferior recess is visualized beneath the lower pole of the inferior apophyseal process, which represents the target point for needle insertion. When direct visualization is difficult the target point is located at the medial projection of the vertebral pedicle. The region overlying each aforementioned target is locally anesthetized with a 1 to 2 ml. volume of 1% Lidocaine without Epinephrine.   The spinal needle was inserted into each of the above mentioned facet joints using biplanar fluoroscopic guidance. A 0.25 to 0.5 ml. volume of Isovue-250 was injected and a partial  facet joint arthrogram was obtained. A single spot film was obtained of the resulting arthrogram.    One to 1.25 ml of the steroid/anesthetic solution was then injected into each of the facet joints noted above.   Additional Comments:  The patient tolerated the procedure well Dressing: 2 x 2 sterile gauze and Band-Aid    Post-procedure details: Patient was observed during the procedure. Post-procedure instructions were  reviewed.  Patient left the clinic in stable condition.     Clinical History: CLINICAL DATA:  Chronic left-sided low back pain without sciatica. Low back pain, greater than 6 weeks. Additional history provided by technologist: Patient reports intermittent low back pain for 3 weeks with left foot numbness and weakness.  EXAM: MRI LUMBAR SPINE WITHOUT CONTRAST  TECHNIQUE: Multiplanar, multisequence MR imaging of the lumbar spine was performed. No intravenous contrast was administered.  COMPARISON:  No pertinent prior studies available for comparison.  FINDINGS: Segmentation: For the purposes of this dictation, five lumbar vertebrae are assumed and the caudal most well-formed intervertebral disc is designated L5-S1.  Alignment: Lumbar levocurvature. 2 mm T12-L1 grade 1 retrolisthesis. Trace L1-L2 grade 1 retrolisthesis. Trace L3-L4 grade 1 anterolisthesis  Vertebrae: The vertebral body height is maintained. Degenerative endplate irregularity and degenerative endplate edema at J4-N8. L4 vertebral body hemangioma.  Conus medullaris and cauda equina: Conus extends to the L1-L2 level. No signal abnormality within the visualized distal spinal cord.  Paraspinal and other soft tissues: No abnormality identified within included portions of the abdomen/retroperitoneum. Atrophy of the lumbar paraspinal musculature.  Disc levels:  Moderate/advanced L4-L5 disc degeneration. Levels of mild and moderate disc degeneration at the remaining levels, greatest at T12-L1, L2-L3 and L3-L4.  T12-L1: Small disc bulge. Mild facet arthrosis. No significant spinal canal or foraminal stenosis.  L1-L2: Small disc bulge. Mild facet arthrosis. No significant spinal canal stenosis. Mild relative left neural foraminal narrowing.  L2-L3: Disc bulge. Superimposed broad-based shallow left subarticular/foraminal disc protrusion. Moderate facet arthrosis with ligamentum flavum hypertrophy.  Mild bilateral subarticular and central canal narrowing. Mild left neural foraminal narrowing.  L3-L4: Disc bulge with endplate spurring. Superimposed small center/left subarticular cranially migrated disc protrusion (series 3, image 9). Moderate/advanced facet arthrosis with ligamentum flavum hypertrophy. Mild left subarticular narrowing. Mild central canal and right subarticular narrowing. Mild/moderate bilateral neural foraminal narrowing.  L4-L5: Disc bulge asymmetric to the right with endplate spurring. Mild-to-moderate facet arthrosis with ligamentum flavum hypertrophy. Mild bilateral subarticular narrowing. Central canal patent. Moderate right neural foraminal narrowing.  L5-S1: Disc bulge. Mild facet arthrosis/ligamentum flavum hypertrophy. Mild right subarticular narrowing with slight crowding of the descending right S1 nerve root. Central canal patent. No significant foraminal stenosis.  IMPRESSION: Lumbar spondylosis as outlined.  No more than mild central canal stenosis at any level. Sites of mild subarticular narrowing greatest on the left at L3-L4 and on the right at L4-L5 and L5-S1.  Multilevel multifactorial neural foraminal narrowing greatest bilaterally at L3-L4 (mild/moderate) and on the right at L4-L5 (moderate).  Moderate/advanced L4-L5 disc degeneration with degenerative endplate marrow signal at this level.   Electronically Signed   By: Kellie Simmering DO   On: 03/04/2020 07:31     Objective:  VS:  HT:    WT:   BMI:     BP:134/80  HR:(!) 105bpm  TEMP: ( )  RESP:  Physical Exam  Ortho Exam Imaging: XR C-ARM NO REPORT  Result Date: 04/04/2020 Please see Notes tab for imaging impression.

## 2020-04-08 DIAGNOSIS — M9903 Segmental and somatic dysfunction of lumbar region: Secondary | ICD-10-CM | POA: Diagnosis not present

## 2020-04-08 DIAGNOSIS — M9905 Segmental and somatic dysfunction of pelvic region: Secondary | ICD-10-CM | POA: Diagnosis not present

## 2020-04-08 DIAGNOSIS — M9904 Segmental and somatic dysfunction of sacral region: Secondary | ICD-10-CM | POA: Diagnosis not present

## 2020-04-08 DIAGNOSIS — M5136 Other intervertebral disc degeneration, lumbar region: Secondary | ICD-10-CM | POA: Diagnosis not present

## 2020-04-11 DIAGNOSIS — M9905 Segmental and somatic dysfunction of pelvic region: Secondary | ICD-10-CM | POA: Diagnosis not present

## 2020-04-11 DIAGNOSIS — M9903 Segmental and somatic dysfunction of lumbar region: Secondary | ICD-10-CM | POA: Diagnosis not present

## 2020-04-11 DIAGNOSIS — M5136 Other intervertebral disc degeneration, lumbar region: Secondary | ICD-10-CM | POA: Diagnosis not present

## 2020-04-11 DIAGNOSIS — M9904 Segmental and somatic dysfunction of sacral region: Secondary | ICD-10-CM | POA: Diagnosis not present

## 2020-04-16 DIAGNOSIS — M9905 Segmental and somatic dysfunction of pelvic region: Secondary | ICD-10-CM | POA: Diagnosis not present

## 2020-04-16 DIAGNOSIS — M5136 Other intervertebral disc degeneration, lumbar region: Secondary | ICD-10-CM | POA: Diagnosis not present

## 2020-04-16 DIAGNOSIS — M9904 Segmental and somatic dysfunction of sacral region: Secondary | ICD-10-CM | POA: Diagnosis not present

## 2020-04-16 DIAGNOSIS — M9903 Segmental and somatic dysfunction of lumbar region: Secondary | ICD-10-CM | POA: Diagnosis not present

## 2020-04-23 ENCOUNTER — Ambulatory Visit: Payer: Medicare Other | Admitting: Family Medicine

## 2020-04-23 ENCOUNTER — Encounter: Payer: Self-pay | Admitting: Family Medicine

## 2020-04-23 ENCOUNTER — Other Ambulatory Visit: Payer: Self-pay

## 2020-04-23 VITALS — BP 138/80 | HR 118 | Temp 98.0°F | Ht 63.25 in | Wt 113.5 lb

## 2020-04-23 DIAGNOSIS — M79642 Pain in left hand: Secondary | ICD-10-CM

## 2020-04-23 DIAGNOSIS — R0989 Other specified symptoms and signs involving the circulatory and respiratory systems: Secondary | ICD-10-CM

## 2020-04-23 DIAGNOSIS — R251 Tremor, unspecified: Secondary | ICD-10-CM

## 2020-04-23 DIAGNOSIS — G629 Polyneuropathy, unspecified: Secondary | ICD-10-CM | POA: Diagnosis not present

## 2020-04-23 DIAGNOSIS — R198 Other specified symptoms and signs involving the digestive system and abdomen: Secondary | ICD-10-CM

## 2020-04-23 DIAGNOSIS — R252 Cramp and spasm: Secondary | ICD-10-CM | POA: Diagnosis not present

## 2020-04-23 DIAGNOSIS — Z9882 Breast implant status: Secondary | ICD-10-CM

## 2020-04-23 DIAGNOSIS — R29898 Other symptoms and signs involving the musculoskeletal system: Secondary | ICD-10-CM

## 2020-04-23 DIAGNOSIS — F419 Anxiety disorder, unspecified: Secondary | ICD-10-CM

## 2020-04-23 DIAGNOSIS — G8929 Other chronic pain: Secondary | ICD-10-CM

## 2020-04-23 DIAGNOSIS — M5442 Lumbago with sciatica, left side: Secondary | ICD-10-CM

## 2020-04-23 MED ORDER — OMEPRAZOLE 20 MG PO CPDR: 20.0000 mg | DELAYED_RELEASE_CAPSULE | Freq: Two times a day (BID) | ORAL | 3 refills | Status: DC

## 2020-04-23 MED ORDER — BUSPIRONE HCL 15 MG PO TABS: 7.5000 mg | ORAL_TABLET | Freq: Two times a day (BID) | ORAL | 3 refills | Status: DC

## 2020-04-23 NOTE — Assessment & Plan Note (Signed)
Foot drop-suspect due to her lumbar disc dz Is now worse  Continues f/u for epidural injection Also neurology visit upcoming

## 2020-04-23 NOTE — Assessment & Plan Note (Signed)
For neuro eval upcoming in June

## 2020-04-23 NOTE — Assessment & Plan Note (Signed)
Rev last labs-nl magnesium occ muscle fasciculations in hands  TSH and FT4 drawn today

## 2020-04-23 NOTE — Assessment & Plan Note (Signed)
And mild cough-worsened after cutting ppi to qd Pt did see ENT with neg w/u Will inc omeprazole back to bid and update if no improvement

## 2020-04-23 NOTE — Assessment & Plan Note (Addendum)
Per pt worse in L hand -but notable in both hands and head today (hands with intension and head while at rest)  Fine tremor -no features of parkinsons  Some anxiety  Checking TSH and FT4 today  Neuro appt upcoming next month

## 2020-04-23 NOTE — Patient Instructions (Addendum)
Try buspar for anxiety  1/2 pill twice daily  If any side effects or you feel worse let me know   Let's check some thyroid tests today   I went back up to twice daily on omeprazole    See neurology as planned  See the plastic surgeon as planned

## 2020-04-23 NOTE — Assessment & Plan Note (Signed)
Mild pain in wrist and palm with hyperextension of fingers  Tremor is more bothersome than that  Tinel/phalen tests neg  No joint changes Rev old xray  For neuro appt next mo

## 2020-04-23 NOTE — Assessment & Plan Note (Signed)
Silicone Per pt some hardening on the R lateral breast  She also wonders if any of her recent somatic c/o are from silicone She has a plastic surgery visit upcoming to address this
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
## 2020-04-23 NOTE — Assessment & Plan Note (Signed)
L foot drop-now having to use cane Planning another epidural injection at one level higher

## 2020-04-23 NOTE — Assessment & Plan Note (Signed)
Anxiety in setting of great stressors both with health and outside problems  Much worry over health and physical symptoms  Also possibly worsening some symptoms  Tremor is worse-unsure if related to anxiety  Trial of buspar 7.5 mg bid  Discussed expectations of this medication including time to effectiveness and mechanism of action, also poss of side effects (early and late)- including mental fuzziness, weight or appetite change, nausea and poss of worse dep or anxiety (even suicidal thoughts)  Pt voiced understanding and will stop med and update if this occurs   May benefit from counseling in the future

## 2020-04-24 LAB — TSH: TSH: 2.4 u[IU]/mL (ref 0.35–4.50)

## 2020-04-24 LAB — T4, FREE: Free T4: 1.14 ng/dL (ref 0.60–1.60)

## 2020-04-25 ENCOUNTER — Ambulatory Visit: Payer: Medicare Other | Admitting: Physical Medicine and Rehabilitation

## 2020-04-25 ENCOUNTER — Other Ambulatory Visit: Payer: Self-pay

## 2020-04-25 ENCOUNTER — Encounter: Payer: Self-pay | Admitting: Physical Medicine and Rehabilitation

## 2020-04-25 ENCOUNTER — Ambulatory Visit: Payer: Self-pay

## 2020-04-25 VITALS — BP 138/82 | HR 89

## 2020-04-25 DIAGNOSIS — R531 Weakness: Secondary | ICD-10-CM

## 2020-04-25 DIAGNOSIS — R202 Paresthesia of skin: Secondary | ICD-10-CM

## 2020-04-25 DIAGNOSIS — M5416 Radiculopathy, lumbar region: Secondary | ICD-10-CM | POA: Diagnosis not present

## 2020-04-25 MED ORDER — METHYLPREDNISOLONE ACETATE 80 MG/ML IJ SUSP
40.0000 mg | Freq: Once | INTRAMUSCULAR | Status: AC
  Administered 2020-04-25: 40 mg

## 2020-04-25 NOTE — Progress Notes (Signed)
Carol Chavez - 74 y.o. female MRN [id number]  Date of birth: [birth year]  Office Visit Note: Visit Date: 04/25/2020 PCP: Judy Pimple, MD Referred by: Tower, Audrie Gallus, MD  Subjective: No chief complaint on file.  HPI:  Carol Chavez is a 74 y.o. female who comes in today for planned Left L4-L5 lumbar epidural steroid injection with fluoroscopic guidance.  The patient has failed conservative care including home exercise, medications, time and activity modification.  This injection will be diagnostic and hopefully therapeutic.  Please see requesting physician notes for further details and justification.  The patient is present with her daughter who is a Engineer, civil (consulting).  We have had this is a planned injection depending on relief of facet joints which were completed a couple weeks ago.  Those notes can be reviewed.  Unfortunately they have a multitude of questions concerning her back pain her weakness in her legs the paresthesias and color changes in the left lower leg.  They were originally referred for injection by Dr. Lavada Mesi.  I tried to answer as many of the questions as I could but I really do not know her complete case.  She last saw Dr. Prince Rome in December and a lot of the communication since that time is been over the phone or through the computer.  Her biggest concern is with the weakness.  She is in a wheelchair today.  She does have some weakness of the left lower extremity.  MRI evidence of the lumbar spine is reviewed with the patient and her daughter once again there is no specific nerve compression although there is some foraminal narrowing.  There is no high-grade central stenosis.  She does get some color changes on the left and she has some signs of may be a approaching complex regional pain syndrome but at this point is really hard to say anything from a diagnostic standpoint.  She has had physical therapy at [facility] physical therapy and she continues to see Dr. Glendale Chard.  One of her  questions was a Dr. Glendale Chard wondered if a MRI with contrast would help.  I did talk to them at length today that an MRI with contrast really would not show much more than a regular MRI unless she really thinking about cancer or tumors or infection.  I think the next step which has been talked about on a few occasions by her primary care physician and Dr. Prince Rome is electrodiagnostic study of the lower limb.  I would recommend this being done by a neurologist.  Unfortunately really no electrodiagnostic study for complex regional pain syndrome but I think it would go a long way into trying to see if this was actually in the leg versus her spine.  We did asked that she make an appointment today to follow-up with Dr. Prince Rome and to go ahead and follow-up with appointment made with a neurologist.  ROS Otherwise per HPI.  Assessment & Plan: Visit Diagnoses:  1. Lumbar radiculopathy   2. Weakness   3. Paresthesia of skin     Plan: No additional findings.   Meds & Orders:  Meds ordered this encounter  Medications  . methylPREDNISolone acetate (DEPO-MEDROL) injection 40 mg    Orders Placed This Encounter  Procedures  . XR C-ARM NO REPORT  . Epidural Steroid injection    Follow-up: Return in 2 weeks (on 05/09/2020) for Lavada Mesi, MD.   Procedures: No procedures performed  Lumbar Epidural Steroid Injection - Interlaminar Approach with Fluoroscopic  Guidance  Patient: Carol Chavez      Date of Birth: 1946/06/14 MRN: [id number] PCP: Abner Greenspan, MD      Visit Date: 04/25/2020   Universal Protocol:     Consent Given By: the patient  Position: PRONE  Additional Comments: Vital signs were monitored before and after the procedure. Patient was prepped and draped in the usual sterile fashion. The correct patient, procedure, and site was verified.   Injection Procedure Details:  Procedure Site One Meds Administered:  Meds ordered this encounter  Medications  . methylPREDNISolone acetate  (DEPO-MEDROL) injection 40 mg     Laterality: Left  Location/Site:  L4-L5  Needle size: 20 G  Needle type: Tuohy  Needle Placement: Paramedian epidural  Findings:   -Comments: Excellent flow of contrast into the epidural space.  Procedure Details: Using a paramedian approach from the side mentioned above, the region overlying the inferior lamina was localized under fluoroscopic visualization and the soft tissues overlying this structure were infiltrated with 4 ml. of 1% Lidocaine without Epinephrine. The Tuohy needle was inserted into the epidural space using a paramedian approach.   The epidural space was localized using loss of resistance along with lateral and bi-planar fluoroscopic views.  After negative aspirate for air, blood, and CSF, a 2 ml. volume of Isovue-250 was injected into the epidural space and the flow of contrast was observed. Radiographs were obtained for documentation purposes.    The injectate was administered into the level noted above.   Additional Comments:  The patient tolerated the procedure well Dressing: 2 x 2 sterile gauze and Band-Aid    Post-procedure details: Patient was observed during the procedure. Post-procedure instructions were reviewed.  Patient left the clinic in stable condition.    Clinical History: CLINICAL DATA:  Chronic left-sided low back pain without sciatica. Low back pain, greater than 6 weeks. Additional history provided by technologist: Patient reports intermittent low back pain for 3 weeks with left foot numbness and weakness.  EXAM: MRI LUMBAR SPINE WITHOUT CONTRAST  TECHNIQUE: Multiplanar, multisequence MR imaging of the lumbar spine was performed. No intravenous contrast was administered.  COMPARISON:  No pertinent prior studies available for comparison.  FINDINGS: Segmentation: For the purposes of this dictation, five lumbar vertebrae are assumed and the caudal most well-formed intervertebral disc is  designated L5-S1.  Alignment: Lumbar levocurvature. 2 mm T12-L1 grade 1 retrolisthesis. Trace L1-L2 grade 1 retrolisthesis. Trace L3-L4 grade 1 anterolisthesis  Vertebrae: The vertebral body height is maintained. Degenerative endplate irregularity and degenerative endplate edema at Z0-C5. L4 vertebral body hemangioma.  Conus medullaris and cauda equina: Conus extends to the L1-L2 level. No signal abnormality within the visualized distal spinal cord.  Paraspinal and other soft tissues: No abnormality identified within included portions of the abdomen/retroperitoneum. Atrophy of the lumbar paraspinal musculature.  Disc levels:  Moderate/advanced L4-L5 disc degeneration. Levels of mild and moderate disc degeneration at the remaining levels, greatest at T12-L1, L2-L3 and L3-L4.  T12-L1: Small disc bulge. Mild facet arthrosis. No significant spinal canal or foraminal stenosis.  L1-L2: Small disc bulge. Mild facet arthrosis. No significant spinal canal stenosis. Mild relative left neural foraminal narrowing.  L2-L3: Disc bulge. Superimposed broad-based shallow left subarticular/foraminal disc protrusion. Moderate facet arthrosis with ligamentum flavum hypertrophy. Mild bilateral subarticular and central canal narrowing. Mild left neural foraminal narrowing.  L3-L4: Disc bulge with endplate spurring. Superimposed small center/left subarticular cranially migrated disc protrusion (series 3, image 9). Moderate/advanced facet arthrosis with ligamentum flavum hypertrophy. Mild left  subarticular narrowing. Mild central canal and right subarticular narrowing. Mild/moderate bilateral neural foraminal narrowing.  L4-L5: Disc bulge asymmetric to the right with endplate spurring. Mild-to-moderate facet arthrosis with ligamentum flavum hypertrophy. Mild bilateral subarticular narrowing. Central canal patent. Moderate right neural foraminal narrowing.  L5-S1: Disc bulge. Mild  facet arthrosis/ligamentum flavum hypertrophy. Mild right subarticular narrowing with slight crowding of the descending right S1 nerve root. Central canal patent. No significant foraminal stenosis.  IMPRESSION: Lumbar spondylosis as outlined.  No more than mild central canal stenosis at any level. Sites of mild subarticular narrowing greatest on the left at L3-L4 and on the right at L4-L5 and L5-S1.  Multilevel multifactorial neural foraminal narrowing greatest bilaterally at L3-L4 (mild/moderate) and on the right at L4-L5 (moderate).  Moderate/advanced L4-L5 disc degeneration with degenerative endplate marrow signal at this level.   Electronically Signed   By: Jackey Loge DO   On: 03/04/2020 07:31     Objective:  VS:  HT:    WT:   BMI:     BP:138/82  HR:89bpm  TEMP: ( )  RESP:  Physical Exam   Imaging: XR C-ARM NO REPORT  Result Date: 04/25/2020 Please see Notes tab for imaging impression.

## 2020-04-25 NOTE — Progress Notes (Signed)
Pt states pain in the back of the left leg all the way down. Pt states holding legs up and walking makes pain worse. Stretching helps with pain. Pt states last injection did not help 04/04/20. ? About both legs and numbness in the lower limb, PCP sent to Neurology for "neuropathy", Daughter nurse navigator breast cancer. Changes in color. ? CRPS. Dr. Glendale Chard ? MRI with contrast  .Numeric Pain Rating Scale and Functional Assessment Average Pain 1   In the last MONTH (on 0-10 scale) has pain interfered with the following?  1. General activity like being  able to carry out your everyday physical activities such as walking, climbing stairs, carrying groceries, or moving a chair?  Rating(4)   +Driver, -BT, -Dye Allergies.

## 2020-04-26 NOTE — Procedures (Signed)
Lumbar Epidural Steroid Injection - Interlaminar Approach with Fluoroscopic Guidance  Patient: Carol Chavez      Date of Birth: 1945/12/19 MRN: [id number] PCP: Judy Pimple, MD      Visit Date: 04/25/2020   Universal Protocol:     Consent Given By: the patient  Position: PRONE  Additional Comments: Vital signs were monitored before and after the procedure. Patient was prepped and draped in the usual sterile fashion. The correct patient, procedure, and site was verified.   Injection Procedure Details:  Procedure Site One Meds Administered:  Meds ordered this encounter  Medications  . methylPREDNISolone acetate (DEPO-MEDROL) injection 40 mg     Laterality: Left  Location/Site:  L4-L5  Needle size: 20 G  Needle type: Tuohy  Needle Placement: Paramedian epidural  Findings:   -Comments: Excellent flow of contrast into the epidural space.  Procedure Details: Using a paramedian approach from the side mentioned above, the region overlying the inferior lamina was localized under fluoroscopic visualization and the soft tissues overlying this structure were infiltrated with 4 ml. of 1% Lidocaine without Epinephrine. The Tuohy needle was inserted into the epidural space using a paramedian approach.   The epidural space was localized using loss of resistance along with lateral and bi-planar fluoroscopic views.  After negative aspirate for air, blood, and CSF, a 2 ml. volume of Isovue-250 was injected into the epidural space and the flow of contrast was observed. Radiographs were obtained for documentation purposes.    The injectate was administered into the level noted above.   Additional Comments:  The patient tolerated the procedure well Dressing: 2 x 2 sterile gauze and Band-Aid    Post-procedure details: Patient was observed during the procedure. Post-procedure instructions were reviewed.  Patient left the clinic in stable condition.

## 2020-04-29 ENCOUNTER — Encounter: Payer: Self-pay | Admitting: Plastic Surgery

## 2020-04-29 ENCOUNTER — Other Ambulatory Visit: Payer: Self-pay

## 2020-04-29 ENCOUNTER — Ambulatory Visit: Payer: Medicare Other | Admitting: Plastic Surgery

## 2020-04-29 VITALS — BP 158/84 | HR 93 | Temp 98.7°F | Ht 63.0 in | Wt 113.0 lb

## 2020-04-29 DIAGNOSIS — T8544XA Capsular contracture of breast implant, initial encounter: Secondary | ICD-10-CM | POA: Diagnosis not present

## 2020-04-29 NOTE — Progress Notes (Signed)
Referring Provider Tower, Wynelle Fanny, MD [place],  [place] [address]   CC:  Chief Complaint  Patient presents with  . Consult    removal of implat      Carol Chavez is an 74 y.o. female.  HPI: Patient presents to discuss her breast implants.  She had breast implants placed 32 years ago in [place].  These were silicone implants.  She did not have trouble with them for quite a while but over the years had noticed the capsule around both implants becoming more firm.  Recently she has developed a constellation of symptoms and she is concerned about breast implant illness.  She said her judgment and personality has changed a bit and she has been a bit more stressed.  She is also had a number of issues with numbness and tingling in her left arm.  She also reports fairly significant weakness in her left leg.  She is seen the orthopedic doctor for orthopedic issue with her left foot and ankle that required a boot for some time.  She has since come out of that but is quite a bit weaker on the left side than previously to the point that she is in a wheelchair now.  She has an upcoming appointment with neurology in 3 weeks.  She wants to know if her breast implants could be causing this and she is leaning towards wanting them to be removed.  She has had regular mammograms that have been normal and have not shown any evidence of rupture of her implants.  No Known Allergies  Outpatient Encounter Medications as of 04/29/2020  Medication Sig  . busPIRone (BUSPAR) 15 MG tablet Take 0.5 tablets (7.5 mg total) by mouth 2 (two) times daily.  . Cholecalciferol (VITAMIN D PO) Take 1 capsule by mouth daily.   . diazepam (VALIUM) 5 MG tablet 1 PO 1 hour before MRI, repeat prn  . estradiol (ESTRACE VAGINAL) 0.1 MG/GM vaginal cream Use a small amount (1-2 cm) intravaginally twice weekly  . gabapentin (NEURONTIN) 100 MG capsule 1 PO q HS, may increase to 3 PO q HS if needed/tolerated  . LUTEIN PO  Take 1 capsule by mouth daily.   Marland Kitchen MAGNESIUM PO Take 2 tablets by mouth 2 (two) times daily.   . Multiple Vitamin (MULTIVITAMIN) capsule Take 1 capsule by mouth daily.  Marland Kitchen omeprazole (PRILOSEC) 20 MG capsule Take 1 capsule (20 mg total) by mouth 2 (two) times daily before a meal.  . ondansetron (ZOFRAN ODT) 4 MG disintegrating tablet Take 1 tablet (4 mg total) by mouth every 8 (eight) hours as needed for nausea or vomiting.   No facility-administered encounter medications on file as of 04/29/2020.     No past medical history on file.  No past surgical history on file.  No family history on file.  Social History   Social History Narrative  . Not on file  Denies tobacco use  Review of Systems General: Denies fevers, chills, weight loss CV: Denies chest pain, shortness of breath, palpitations  Physical Exam Vitals with BMI 04/29/2020 04/25/2020 04/23/2020  Height 5\' 3"  - -  Weight 113 lbs - -  BMI 87.86 - -  Systolic 767 209 470  Diastolic 84 82 80  Pulse 93 89 -    General:  No acute distress,  Alert and oriented, Non-Toxic, Normal speech and affect Breast: She has grade 3 capsular contracture on both sides.  It looks like both implants  were placed through a periareolar incision.  There are reasonable symmetric position although they are a bit high.  I do not see any abnormalities on the skin or any masses surrounding the implants.  The implants do consist of the vast majority of her breast tissue.  Assessment/Plan I long discussion with the patient and her husband about how to proceed.  They are understandably very concerned about her degree of deterioration in her health.  I explained that I did not feel that breast implant illness was the most likely explanation for her constellation of symptoms.  I do feel that it would be valuable for her to have a neurology work-up and that is coming up.  They are concerned enough that they are leaning towards having the implants removed  regardless just to take it off the table as a potential contributor to her health.  I think her capsules are firm enough that I would advise her to have capsulectomy when the implants are removed.  I discussed doing this today and explained the risks and benefits.  The risks we discussed her bleeding, infection, damage surrounding structures, need for additional procedures.  She wants to think about this and talk it over with her husband.  We will plan to wait to hear from them on how they like to proceed.  Ultimately I do not see that our hand is forced when it comes to removing the implants.  But I also think is reasonable to move forward with removing them if that is her wish.  I spent over 45 minutes talking with the patient and her husband about the various options.  Allena Napoleon 04/29/2020, 1:56 PM

## 2020-05-01 DIAGNOSIS — M9903 Segmental and somatic dysfunction of lumbar region: Secondary | ICD-10-CM | POA: Diagnosis not present

## 2020-05-01 DIAGNOSIS — M9904 Segmental and somatic dysfunction of sacral region: Secondary | ICD-10-CM | POA: Diagnosis not present

## 2020-05-01 DIAGNOSIS — M9905 Segmental and somatic dysfunction of pelvic region: Secondary | ICD-10-CM | POA: Diagnosis not present

## 2020-05-01 DIAGNOSIS — M5136 Other intervertebral disc degeneration, lumbar region: Secondary | ICD-10-CM | POA: Diagnosis not present

## 2020-05-03 ENCOUNTER — Other Ambulatory Visit: Payer: Self-pay

## 2020-05-03 ENCOUNTER — Encounter: Payer: Self-pay | Admitting: Family Medicine

## 2020-05-03 ENCOUNTER — Ambulatory Visit: Payer: Medicare Other | Admitting: Family Medicine

## 2020-05-03 DIAGNOSIS — M545 Low back pain, unspecified: Secondary | ICD-10-CM

## 2020-05-03 DIAGNOSIS — G8929 Other chronic pain: Secondary | ICD-10-CM | POA: Diagnosis not present

## 2020-05-03 DIAGNOSIS — M21371 Foot drop, right foot: Secondary | ICD-10-CM | POA: Diagnosis not present

## 2020-05-03 DIAGNOSIS — M21372 Foot drop, left foot: Secondary | ICD-10-CM | POA: Diagnosis not present

## 2020-05-03 MED ORDER — TERBINAFINE HCL 250 MG PO TABS: 250.0000 mg | ORAL_TABLET | Freq: Every day | ORAL | 1 refills | Status: DC

## 2020-05-03 NOTE — Progress Notes (Signed)
Pt stated she received 50% improvement from the most recent injection by Dr. Alvester Morin

## 2020-05-03 NOTE — Progress Notes (Signed)
Office Visit Note   Patient: Carol Chavez           Date of Birth: Apr 02, 1946           MRN: [id number] Visit Date: 05/03/2020 Requested by: Tower, Audrie Gallus, MD [address] [place],  [place] [address] PCP: Judy Pimple, MD  Subjective: Chief Complaint  Patient presents with  . Lower Back - Pain    HPI: She is here for follow-up bilateral leg pain and weakness. She has had facet injections which did not help. She had an epidural injection recently which helped a little bit. She continues to have some pain in the hamstring area and weakness in both of her ankles. She is scheduled to see neurology June 14. She started to notice some weakness in her left hand as well. Denies any headaches, vision change, unintentional weight change, denies fevers or chills.              ROS: She has a rash on the anterior aspect of her right ankle. All other systems were reviewed and are negative.  Objective: Vital Signs: There were no vitals taken for this visit.  Physical Exam:  General:  Alert and oriented, in no acute distress. Pulm:  Breathing unlabored. Psy:  Normal mood, congruent affect. Skin: There is an erythematous rash on the anterior right ankle consistent with tinea infection. Left arm: She has slight tremor of her thumb. She has moderate atrophy of the webspace between the first and second digits. Legs: She has tight hamstrings bilaterally with negative straight leg raise. Good hip range of motion bilaterally with no pain. Hip flexion, knee extension strength are normal and ankle plantar flexion strength normal but she has substantial weakness with ankle dorsiflexion and eversion bilaterally.  Imaging: No results found.  Assessment & Plan: 1. Chronic bilateral foot drop -Lumbar MRI scan really did not explain her symptoms. She has not had much improvement with injections. I believe she needs to proceed with nerve studies and neurology consultation. -Prescription given for  AFO braces. She would probably benefit from using a walker instead of a cane. She did not want any new medications.     Procedures: No procedures performed  No notes on file     PMFS History: Patient Active Problem List   Diagnosis Date Noted  . Left hand pain 04/23/2020  . Muscle cramps 04/23/2020  . Globus sensation 04/23/2020  . Anxiety 04/23/2020  . H/O bilateral breast implants 04/23/2020  . Tremor 04/02/2020  . Liver lesion 02/22/2020  . Lesion of spleen 02/22/2020  . Dizziness 02/20/2020  . Neuropathy 02/20/2020  . Weakness of foot, left 02/20/2020  . Nausea 02/20/2020  . Chronic cough 12/20/2019  . Chronic bilateral low back pain with left-sided sciatica 12/20/2019  . Routine general medical examination at a health care facility 07/27/2016  . Hyponatremia 04/26/2016  . Prediabetes 04/26/2016  . Encounter for Medicare annual wellness exam 06/13/2013  . Colon cancer screening 06/13/2013  . Hyperlipidemia 07/25/2009  . VAGINITIS, ATROPHIC, POSTMENOPAUSAL 07/25/2009   History reviewed. No pertinent past medical history.  History reviewed. No pertinent family history.  History reviewed. No pertinent surgical history. Social History   Occupational History  . Not on file  Tobacco Use  . Smoking status: Never Smoker  . Smokeless tobacco: Never Used  Substance and Sexual Activity  . Alcohol use: Yes    Comment: occ  . Drug use: No  . Sexual activity: Never

## 2020-05-07 DIAGNOSIS — M9905 Segmental and somatic dysfunction of pelvic region: Secondary | ICD-10-CM | POA: Diagnosis not present

## 2020-05-07 DIAGNOSIS — M9904 Segmental and somatic dysfunction of sacral region: Secondary | ICD-10-CM | POA: Diagnosis not present

## 2020-05-07 DIAGNOSIS — M5136 Other intervertebral disc degeneration, lumbar region: Secondary | ICD-10-CM | POA: Diagnosis not present

## 2020-05-07 DIAGNOSIS — M9903 Segmental and somatic dysfunction of lumbar region: Secondary | ICD-10-CM | POA: Diagnosis not present

## 2020-05-08 DIAGNOSIS — M21372 Foot drop, left foot: Secondary | ICD-10-CM | POA: Diagnosis not present

## 2020-05-09 DIAGNOSIS — M5136 Other intervertebral disc degeneration, lumbar region: Secondary | ICD-10-CM | POA: Diagnosis not present

## 2020-05-09 DIAGNOSIS — M9904 Segmental and somatic dysfunction of sacral region: Secondary | ICD-10-CM | POA: Diagnosis not present

## 2020-05-09 DIAGNOSIS — M9905 Segmental and somatic dysfunction of pelvic region: Secondary | ICD-10-CM | POA: Diagnosis not present

## 2020-05-09 DIAGNOSIS — M9903 Segmental and somatic dysfunction of lumbar region: Secondary | ICD-10-CM | POA: Diagnosis not present

## 2020-05-17 ENCOUNTER — Encounter: Payer: Self-pay | Admitting: *Deleted

## 2020-05-20 ENCOUNTER — Telehealth: Payer: Self-pay | Admitting: Diagnostic Neuroimaging

## 2020-05-20 ENCOUNTER — Encounter: Payer: Self-pay | Admitting: Diagnostic Neuroimaging

## 2020-05-20 ENCOUNTER — Ambulatory Visit: Payer: Medicare Other | Admitting: Diagnostic Neuroimaging

## 2020-05-20 VITALS — BP 161/87 | HR 105 | Ht 63.25 in | Wt 116.4 lb

## 2020-05-20 DIAGNOSIS — R531 Weakness: Secondary | ICD-10-CM | POA: Diagnosis not present

## 2020-05-20 DIAGNOSIS — G629 Polyneuropathy, unspecified: Secondary | ICD-10-CM

## 2020-05-20 NOTE — Telephone Encounter (Signed)
[facility] medicare order sent to [facility]. No auth they will reach out to the patient to schedule.  

## 2020-05-20 NOTE — Progress Notes (Signed)
[facility]  PATIENT: Carol Chavez DOB: January 18, 1946  REFERRING CLINICIAN: Tower, Carol Fanny, MD HISTORY FROM: patient  REASON FOR VISIT: new consult    HISTORICAL  CHIEF COMPLAINT:  Chief Complaint  Patient presents with  . Neuropathy, tremor    rm 6 New Pt husband- Carol Chavez "neuropathy in feet x 20 years, foot drop on L; general feeling of uneasiness inside"     HISTORY OF PRESENT ILLNESS:   74 year old female here for evaluation of numbness, weakness and tremor.  Around the year 2000 patient had onset of numbness in arms and legs, saw neurologist and was diagnosed with neuropathy.  No specific cause was found.  In past 3 to 6 months patient has had more rapidly progressive numbness and weakness, mainly in her left arm and left leg.  Right leg also affected.  Her balance and walking is severely affected.  She is at several falls.  She is also lost appetite and lost some weight (10 pounds over the last 6 months).  She has had some intermittent tremor in bilateral hands, left worse than right.  She also had some left hip and left groin pain, treated with PT and chiropractic sessions.  Patient now using a cane.  Her balance is quite affected.  Speech is somewhat soft and hoarse.   REVIEW OF SYSTEMS: Full 14 system review of systems performed and negative with exception of: As per HPI.  ALLERGIES: No Known Allergies  HOME MEDICATIONS: Outpatient Medications Prior to Visit  Medication Sig Dispense Refill  . busPIRone (BUSPAR) 15 MG tablet Take 0.5 tablets (7.5 mg total) by mouth 2 (two) times daily. 30 tablet 3  . Cholecalciferol (VITAMIN D PO) Take 1 capsule by mouth daily.     . diazepam (VALIUM) 5 MG tablet 1 PO 1 hour before MRI, repeat prn 5 tablet 0  . estradiol (ESTRACE VAGINAL) 0.1 MG/GM vaginal cream Use a small amount (1-2 cm) intravaginally twice weekly 42.5 g 5  . LUTEIN PO Take 1 capsule by mouth daily.     Marland Kitchen MAGNESIUM PO Take 2 tablets by mouth 2  (two) times daily.     . Multiple Vitamin (MULTIVITAMIN) capsule Take 1 capsule by mouth daily.    Marland Kitchen omeprazole (PRILOSEC) 20 MG capsule Take 1 capsule (20 mg total) by mouth 2 (two) times daily before a meal. 60 capsule 3  . ondansetron (ZOFRAN ODT) 4 MG disintegrating tablet Take 1 tablet (4 mg total) by mouth every 8 (eight) hours as needed for nausea or vomiting. 12 tablet 0  . terbinafine (LAMISIL) 250 MG tablet Take 1 tablet (250 mg total) by mouth daily. 30 tablet 1  . gabapentin (NEURONTIN) 100 MG capsule 1 PO q HS, may increase to 3 PO q HS if needed/tolerated (Patient not taking: Reported on 05/20/2020) 90 capsule 3   No facility-administered medications prior to visit.    PAST MEDICAL HISTORY: Past Medical History:  Diagnosis Date  . Balance problem   . Foot drop, left   . GERD (gastroesophageal reflux disease)   . Memory changes    pt denies  . Vitamin D deficiency    pt denies    PAST SURGICAL HISTORY: Past Surgical History:  Procedure Laterality Date  . APPENDECTOMY    . CESAREAN SECTION    . CYST EXCISION     pilonidal  . PLACEMENT OF BREAST IMPLANTS  1988    FAMILY HISTORY: Family History  Problem Relation Age of Onset  .  Breast cancer Mother     SOCIAL HISTORY: Social History   Socioeconomic History  . Marital status: Married    Spouse name: Carol Chavez  . Number of children: Not on file  . Years of education: Not on file  . Highest education level: Not on file  Occupational History    Comment: na  Tobacco Use  . Smoking status: Never Smoker  . Smokeless tobacco: Never Used  Substance and Sexual Activity  . Alcohol use: Yes    Comment: occ  . Drug use: No  . Sexual activity: Never  Other Topics Concern  . Not on file  Social History Narrative   Lives with husband   Social Determinants of Health   Financial Resource Strain:   . Difficulty of Paying Living Expenses:   Food Insecurity:   . Worried About Charity fundraiser in the Last Year:    . Arboriculturist in the Last Year:   Transportation Needs:   . Film/video editor (Medical):   Marland Kitchen Lack of Transportation (Non-Medical):   Physical Activity:   . Days of Exercise per Week:   . Minutes of Exercise per Session:   Stress:   . Feeling of Stress :   Social Connections:   . Frequency of Communication with Friends and Family:   . Frequency of Social Gatherings with Friends and Family:   . Attends Religious Services:   . Active Member of Clubs or Organizations:   . Attends Archivist Meetings:   Marland Kitchen Marital Status:   Intimate Partner Violence:   . Fear of Current or Ex-Partner:   . Emotionally Abused:   Marland Kitchen Physically Abused:   . Sexually Abused:      PHYSICAL EXAM  GENERAL EXAM/CONSTITUTIONAL: Vitals:  Vitals:   05/20/20 1016  BP: (!) 161/87  Pulse: (!) 105  Weight: 116 lb 6.4 oz (52.8 kg)  Height: 5' 3.25" (1.607 m)     Body mass index is 20.46 kg/m. Wt Readings from Last 3 Encounters:  05/20/20 116 lb 6.4 oz (52.8 kg)  04/29/20 113 lb (51.3 kg)  04/23/20 113 lb 8 oz (51.5 kg)     Patient is in no distress; well developed, nourished and groomed; neck is supple  CARDIOVASCULAR:  Examination of carotid arteries is normal; no carotid bruits  Regular rate and rhythm, no murmurs  Examination of peripheral vascular system by observation and palpation is normal  EYES:  Ophthalmoscopic exam of optic discs and posterior segments is normal; no papilledema or hemorrhages  No exam data present  MUSCULOSKELETAL:  Gait, strength, tone, movements noted in Neurologic exam below  NEUROLOGIC: MENTAL STATUS:  MMSE - [place] Exam 05/15/2016  Orientation to time 5  Orientation to Place 5  Registration 3  Attention/ Calculation 0  Recall 3  Language- name 2 objects 0  Language- repeat 1  Language- follow 3 step command 3  Language- read & follow direction 0  Write a sentence 0  Copy design 0  Total score 20    awake, alert,  oriented to person, place and time  recent and remote memory intact  normal attention and concentration  language fluent, comprehension intact, naming intact  fund of knowledge appropriate  CRANIAL NERVE:   2nd - no papilledema on fundoscopic exam  2nd, 3rd, 4th, 6th - pupils equal and reactive to light, visual fields full to confrontation, extraocular muscles intact, no nystagmus  5th - facial sensation symmetric  7th - facial  strength symmetric  8th - hearing intact  9th - palate elevates symmetrically, uvula midline  11th - shoulder shrug symmetric  12th - tongue protrusion midline; MILD FASCICULATIONS IN TONGUE  MOTOR:   normal bulk and tone; BUE 4 EXCEPT TRICEPS 3; ATROPHY IN HAND INTRINSIC MUSCLES AND WEAKNESS (3); BILATERAL HIP FLEX 3; KNEE FLEX 3; DORSIFLEXION (RIGHT 2-3; LEFT 1-2; LEFT AFO)  SENSORY:   normal and symmetric to light touch, temperature, vibration; EXCEPT DECR IN LEFT FOOT  COORDINATION:   finger-nose-finger, fine finger movements normal  REFLEXES:   deep tendon reflexes --> BUE 1, KNEES 2, ANKLES 0  GAIT/STATION:   narrow based gait; STEPPAGE GAIT; USES CANE; SLOW TO RISE FROM CHAIR     DIAGNOSTIC DATA (LABS, IMAGING, TESTING) - I reviewed patient records, labs, notes, testing and imaging myself where available.  Lab Results  Component Value Date   WBC 9.8 02/20/2020   HGB 14.1 02/20/2020   HCT 42.2 02/20/2020   MCV 91.5 02/20/2020   PLT 313.0 02/20/2020      Component Value Date/Time   NA 136 02/20/2020 1245   K 3.8 02/20/2020 1245   CL 100 02/20/2020 1245   CO2 28 02/20/2020 1245   GLUCOSE 91 02/20/2020 1245   BUN 8 02/20/2020 1245   CREATININE 0.48 02/20/2020 1245   CALCIUM 9.6 02/20/2020 1245   PROT 7.1 02/20/2020 1245   ALBUMIN 4.2 02/20/2020 1245   ALBUMIN 4.2 02/20/2020 1245   AST 17 02/20/2020 1245   ALT 17 02/20/2020 1245   ALKPHOS 65 02/20/2020 1245   BILITOT 0.4 02/20/2020 1245   GFRNONAA >60 02/08/2020  2123   GFRAA >60 02/08/2020 2123   Lab Results  Component Value Date   CHOL 233 (H) 10/05/2019   HDL 52.60 10/05/2019   LDLCALC 143 (H) 10/05/2019   LDLDIRECT 165.9 06/06/2013   TRIG 188.0 (H) 10/05/2019   CHOLHDL 4 10/05/2019   Lab Results  Component Value Date   HGBA1C 5.8 10/05/2019   No results found for: VITAMINB12 Lab Results  Component Value Date   TSH 2.40 04/23/2020       ASSESSMENT AND PLAN  74 y.o. year old female here with history of idiopathic neuropathy since 2000, now with subacute, rapid progressive numbness and weakness in arms and legs over past 6 months in 2021.  Could represent inflammatory neuropathy (CIDP).  Myopathy, neuromuscular junction disorders, CNS etiology is also a consideration.  We will proceed with further work-up.  Dx:  1. Weakness   2. Neuropathy       PLAN:  RAPID PROGRESSIVE NUMBNESS / WEAKNESS - check MRI brain, cervical spine; labs; EMG/NCS - use rollator walker  Orders Placed This Encounter  Procedures  . For home use only DME 4 wheeled rolling walker with seat  . MR BRAIN W WO CONTRAST  . MR CERVICAL SPINE W WO CONTRAST  . CBC with diff  . CMP  . Vitamin B12  . MMA  . Homocysteine  . A1c  . TSH  . SPEP with IFE  . ANA w/Reflex  . SSA, SSB  . ESR  . CRP  . HIV  . RPR  . Hepatitis B surface antibody, qualitative  . Hepatitis B core antibody, total  . Hepatitis B surface antigen  . Vitamin B1  . Hepatitis C antibody  . Vitamin B6  . ANCA  . Copper  . CK  . Aldolase  . AChR Abs with Reflex to MuSK  . NCV with EMG(electromyography)  Return for pending if symptoms worsen or fail to improve, for NCV/EMG. pending test results     Penni Bombard, MD 4/46/9507, 22:57 AM Certified in Neurology, Neurophysiology and [facility] [address] [place], [place] [address] [phone]

## 2020-05-29 ENCOUNTER — Institutional Professional Consult (permissible substitution): Payer: Medicare Other | Admitting: Plastic Surgery

## 2020-06-01 LAB — VITAMIN B6: Vitamin B6: 19.2 ug/L (ref 2.0–32.8)

## 2020-06-01 LAB — MULTIPLE MYELOMA PANEL, SERUM
Albumin SerPl Elph-Mcnc: 3.8 g/dL (ref 2.9–4.4)
Albumin/Glob SerPl: 1.3 (ref 0.7–1.7)
Alpha 1: 0.2 g/dL (ref 0.0–0.4)
Alpha2 Glob SerPl Elph-Mcnc: 0.8 g/dL (ref 0.4–1.0)
B-Globulin SerPl Elph-Mcnc: 1.1 g/dL (ref 0.7–1.3)
Gamma Glob SerPl Elph-Mcnc: 1 g/dL (ref 0.4–1.8)
Globulin, Total: 3.1 g/dL (ref 2.2–3.9)
IgA/Immunoglobulin A, Serum: 210 mg/dL (ref 64–422)
IgG (Immunoglobin G), Serum: 955 mg/dL (ref 586–1602)
IgM (Immunoglobulin M), Srm: 115 mg/dL (ref 26–217)

## 2020-06-01 LAB — SJOGREN'S SYNDROME ANTIBODS(SSA + SSB)
ENA SSA (RO) Ab: <0.2 AI (ref 0.0–0.9)
ENA SSB (LA) Ab: <0.2 AI (ref 0.0–0.9)

## 2020-06-01 LAB — CBC WITH DIFFERENTIAL/PLATELET
Basophils Absolute: 0 10*3/uL (ref 0.0–0.2)
Basos: 0 %
EOS (ABSOLUTE): 0 10*3/uL (ref 0.0–0.4)
Eos: 0 %
Hematocrit: 43.9 % (ref 34.0–46.6)
Hemoglobin: 14.7 g/dL (ref 11.1–15.9)
Immature Grans (Abs): 0 10*3/uL (ref 0.0–0.1)
Immature Granulocytes: 0 %
Lymphocytes Absolute: 1.4 10*3/uL (ref 0.7–3.1)
Lymphs: 13 %
MCH: 30.2 pg (ref 26.6–33.0)
MCHC: 33.5 g/dL (ref 31.5–35.7)
MCV: 90 fL (ref 79–97)
Monocytes Absolute: 1 10*3/uL — ABNORMAL HIGH (ref 0.1–0.9)
Monocytes: 9 %
Neutrophils Absolute: 8.5 10*3/uL — ABNORMAL HIGH (ref 1.4–7.0)
Neutrophils: 78 %
Platelets: 302 10*3/uL (ref 150–450)
RBC: 4.87 x10E6/uL (ref 3.77–5.28)
RDW: 13.9 % (ref 11.7–15.4)
WBC: 11 10*3/uL — ABNORMAL HIGH (ref 3.4–10.8)

## 2020-06-01 LAB — COMPREHENSIVE METABOLIC PANEL
ALT: 16 IU/L (ref 0–32)
AST: 16 IU/L (ref 0–40)
Albumin/Globulin Ratio: 1.9 (ref 1.2–2.2)
Albumin: 4.5 g/dL (ref 3.7–4.7)
Alkaline Phosphatase: 85 IU/L (ref 48–121)
BUN/Creatinine Ratio: 27 (ref 12–28)
BUN: 12 mg/dL (ref 8–27)
Bilirubin Total: 0.2 mg/dL (ref 0.0–1.2)
CO2: 24 mmol/L (ref 20–29)
Calcium: 10.4 mg/dL — ABNORMAL HIGH (ref 8.7–10.3)
Chloride: 96 mmol/L (ref 96–106)
Creatinine, Ser: 0.45 mg/dL — ABNORMAL LOW (ref 0.57–1.00)
GFR calc Af Amer: 114 mL/min/{1.73_m2} (ref >59)
GFR calc non Af Amer: 99 mL/min/{1.73_m2} (ref >59)
Globulin, Total: 2.4 g/dL (ref 1.5–4.5)
Glucose: 69 mg/dL (ref 65–99)
Potassium: 4.4 mmol/L (ref 3.5–5.2)
Sodium: 137 mmol/L (ref 134–144)
Total Protein: 6.9 g/dL (ref 6.0–8.5)

## 2020-06-01 LAB — ANA W/REFLEX: ANA Titer 1: NEGATIVE

## 2020-06-01 LAB — HEPATITIS C ANTIBODY: Hep C Virus Ab: <0.1 s/co ratio (ref 0.0–0.9)

## 2020-06-01 LAB — PAN-ANCA
ANCA Proteinase 3: <3.5 U/mL (ref 0.0–3.5)
Atypical pANCA: <1:20 {titer}
C-ANCA: <1:20 {titer}
Myeloperoxidase Ab: <9 U/mL (ref 0.0–9.0)
P-ANCA: <1:20 {titer}

## 2020-06-01 LAB — CK: Total CK: 135 U/L (ref 32–182)

## 2020-06-01 LAB — SEDIMENTATION RATE: Sed Rate: 28 mm/hr (ref 0–40)

## 2020-06-01 LAB — HEMOGLOBIN A1C
Est. average glucose Bld gHb Est-mCnc: 117 mg/dL
Hgb A1c MFr Bld: 5.7 % — ABNORMAL HIGH (ref 4.8–5.6)

## 2020-06-01 LAB — VITAMIN B1: Thiamine: 179.3 nmol/L (ref 66.5–200.0)

## 2020-06-01 LAB — C-REACTIVE PROTEIN: CRP: <1 mg/L (ref 0–10)

## 2020-06-01 LAB — METHYLMALONIC ACID, SERUM: Methylmalonic Acid: 116 nmol/L (ref 0–378)

## 2020-06-01 LAB — VITAMIN B12: Vitamin B-12: 1111 pg/mL (ref 232–1245)

## 2020-06-01 LAB — RPR: RPR Ser Ql: NONREACTIVE

## 2020-06-01 LAB — HIV ANTIBODY (ROUTINE TESTING W REFLEX): HIV Screen 4th Generation wRfx: NONREACTIVE

## 2020-06-01 LAB — HEPATITIS B SURFACE ANTIBODY,QUALITATIVE: Hep B Surface Ab, Qual: NONREACTIVE

## 2020-06-01 LAB — MUSK ANTIBODIES: MuSK Antibodies: <1 U/mL

## 2020-06-01 LAB — HEPATITIS B CORE ANTIBODY, TOTAL: Hep B Core Total Ab: NEGATIVE

## 2020-06-01 LAB — ACHR ABS WITH REFLEX TO MUSK: AChR Binding Ab, Serum: <0.03 nmol/L (ref 0.00–0.24)

## 2020-06-01 LAB — TSH: TSH: 1.84 u[IU]/mL (ref 0.450–4.500)

## 2020-06-01 LAB — HEPATITIS B SURFACE ANTIGEN: Hepatitis B Surface Ag: NEGATIVE

## 2020-06-01 LAB — ALDOLASE: Aldolase: 5.9 U/L (ref 3.3–10.3)

## 2020-06-01 LAB — COPPER, SERUM: Copper: 103 ug/dL (ref 80–158)

## 2020-06-01 LAB — HOMOCYSTEINE: Homocysteine: 5.9 umol/L (ref 0.0–19.2)

## 2020-06-04 ENCOUNTER — Telehealth: Payer: Self-pay | Admitting: *Deleted

## 2020-06-04 NOTE — Telephone Encounter (Signed)
Spoke with patient and informed her that her labs are unremarkable except borderline WBC. She is being treated for a fungal infection but  otherwise feels well. She asked about low creatinine; I advised MD didn't indicate it was a concern. She FU with PCP regularly and gets labs done. I advised PCP can FU on creatinine. Patient verbalized understanding, appreciation.

## 2020-06-07 ENCOUNTER — Other Ambulatory Visit: Payer: Self-pay

## 2020-06-07 ENCOUNTER — Ambulatory Visit
Admission: RE | Admit: 2020-06-07 | Discharge: 2020-06-07 | Disposition: A | Discharge status: Home / Self Care | Payer: Medicare Other | Source: Ambulatory Visit | Attending: Diagnostic Neuroimaging | Admitting: Diagnostic Neuroimaging

## 2020-06-07 DIAGNOSIS — G629 Polyneuropathy, unspecified: Secondary | ICD-10-CM

## 2020-06-07 DIAGNOSIS — R531 Weakness: Secondary | ICD-10-CM

## 2020-06-07 MED ORDER — GADOBENATE DIMEGLUMINE 529 MG/ML IV SOLN
10.0000 mL | Freq: Once | INTRAVENOUS | Status: AC | PRN
  Administered 2020-06-07: 10 mL via INTRAVENOUS

## 2020-06-20 ENCOUNTER — Ambulatory Visit (INDEPENDENT_AMBULATORY_CARE_PROVIDER_SITE_OTHER): Payer: Medicare Other | Admitting: Diagnostic Neuroimaging

## 2020-06-20 ENCOUNTER — Encounter: Payer: Medicare Other | Admitting: Diagnostic Neuroimaging

## 2020-06-20 DIAGNOSIS — R531 Weakness: Secondary | ICD-10-CM | POA: Diagnosis not present

## 2020-06-20 DIAGNOSIS — Z0289 Encounter for other administrative examinations: Secondary | ICD-10-CM

## 2020-06-20 DIAGNOSIS — G629 Polyneuropathy, unspecified: Secondary | ICD-10-CM

## 2020-06-20 NOTE — Procedures (Signed)
[facility]  NCS (NERVE CONDUCTION STUDY) WITH EMG (ELECTROMYOGRAPHY) REPORT   STUDY DATE: 06/20/20 PATIENT NAME: Carol Chavez DOB: 11-18-1946 MRN: [id number]  ORDERING CLINICIAN: Joycelyn Schmid, MD   TECHNOLOGIST: Durenda Age ELECTROMYOGRAPHER: Glenford Bayley. Jessabelle Markiewicz, MD  CLINICAL INFORMATION: 74 year old female with new onset weakness in arms and legs, speech and swallow difficulty.  FINDINGS: NERVE CONDUCTION STUDY:  Left median, left ulnar, left peroneal and left tibial motor responses have decreased amplitudes and slow conduction velocities (except left ulnar motor responses normal conduction velocity).  Left median, left peroneal, left tibial distal latencies are prolonged.  Right sural and right superficial peroneal sensory responses have prolonged peak latencies and normal amplitudes.  Left median and left ulnar sensory responses are normal.  Left tibial and left peroneal F-wave latencies could not be obtained.  Left median F-wave latency could not be obtained.  Left ulnar F-wave latency is prolonged.   NEEDLE ELECTROMYOGRAPHY:  Needle examination of left upper and left lower extremities:  -Abnormal spontaneous activity noted in left flexor carpi radialis, left first dorsal interosseous, left vastus medialis, left tibialis anterior, left gastrocnemius muscles. -Chronic denervation changes noted in left first dorsal interosseous, left vastus medialis, left gastrocnemius. -Myopathic recruitment pattern noted in left biceps.  Occasional fasciculations also noted in left biceps. -Absent motor unit recruitment noted in left tibialis anterior.   IMPRESSION:   Abnormal study demonstrating: - Predominantly axonal sensorimotor polyneuropathy.  Active and chronic denervation changes noted in left upper and lower extremity.  Myopathic recruitment pattern noted in left biceps with associated fasciculations at rest.  Overall findings are more significant in the  lower extremity.    INTERPRETING PHYSICIAN:  Suanne Marker, MD Certified in Neurology, Neurophysiology and Neuroimaging  [facility] [address], [address] [place], [place] [address] [phone]   [facility]    Nerve / Sites Muscle Latency Ref. Amplitude Ref. Rel Amp Segments Distance Velocity Ref. Area    ms ms mV mV %  cm m/s m/s mVms  L Median - APB     Wrist APB 6.1 ?4.4 0.1 ?4.0 100 Wrist - APB 7   0.4     Upper arm APB 11.5  0.3  235 Upper arm - Wrist 21 39 ?49 0.9  L Ulnar - ADM     Wrist ADM 3.3 ?3.3 2.5 ?6.0 100 Wrist - ADM 7   8.1     B.Elbow ADM 7.3  2.3  92.2 B.Elbow - Wrist 20 49 ?49 8.6     A.Elbow ADM 9.4  2.0  87.3 A.Elbow - B.Elbow 10 49 ?49 8.0         A.Elbow - Wrist      L Peroneal - EDB     Ankle EDB 7.3 ?6.5 0.2 ?2.0 100 Ankle - EDB 9   1.4     Fib head EDB 15.4  0.3  104 Fib head - Ankle 26 32 ?44 1.1     Pop fossa EDB 18.3  0.4  145 Pop fossa - Fib head 10 34 ?44 2.1         Pop fossa - Ankle      L Tibial - AH     Ankle AH 6.6 ?5.8 0.3 ?4.0 100 Ankle - AH 9   1.6     Pop fossa AH 18.4  0.3  108 Pop fossa - Ankle 37 32 ?41 1.5  SNC    Nerve / Sites Rec. Site Peak Lat Ref.  Amp Ref. Segments Distance    ms ms V V  cm  L Sural - Ankle (Calf)     Calf Ankle 5.4 ?4.4 8 ?6 Calf - Ankle 14  L Superficial peroneal - Ankle     Lat leg Ankle 5.0 ?4.4 6 ?6 Lat leg - Ankle 14  L Median - Orthodromic (Dig II, Mid palm)     Dig II Wrist 2.9 ?3.4 19 ?10 Dig II - Wrist 13  L Ulnar - Orthodromic, (Dig V, Mid palm)     Dig V Wrist 2.6 ?3.1 11 ?5 Dig V - Wrist 58             F  Wave    Nerve F Lat Ref.   ms ms  L Peroneal - EDB NR ?56.0  L Tibial - AH NR ?56.0  L Median - APB NR ?31.0  L Ulnar - ADM 40.4 ?32.0             EMG Summary Table    Spontaneous MUAP Recruitment  Muscle IA Fib PSW Fasc Other Amp Dur. Poly Pattern  L. Deltoid Normal None None None _______ Normal Normal Normal Normal  L. Biceps brachii Normal  None None Occasional _______ Decreased Normal 2+ Early  L. Triceps brachii Normal None None None _______ Normal Normal Normal Normal  L. Flexor carpi radialis Normal 1+ None None CRDs Normal Normal Normal Normal  L. First dorsal interosseous Normal 1+ 1+ None _______ Increased Normal Normal Reduced  L. Vastus medialis Normal 1+ 1+ None _______ Increased Normal Normal Discrete  L. Tibialis anterior Normal 1+ 1+ None _______ Normal Normal Normal No Activity  L. Gastrocnemius (Medial head) Normal 1+ 1+ None _______ Decreased Normal Normal Discrete

## 2020-06-21 ENCOUNTER — Telehealth: Payer: Self-pay | Admitting: Diagnostic Neuroimaging

## 2020-06-21 NOTE — Telephone Encounter (Signed)
Cavell,Geoffrey(husband on DPR) has called re: the referral that was sent to [facility] re: [facility] for pt.  Husband states they want this done thru [facility].  Please call to discuss location change from [facility] to [facility]

## 2020-06-24 ENCOUNTER — Encounter: Payer: Self-pay | Admitting: Family Medicine

## 2020-06-24 ENCOUNTER — Encounter: Payer: Self-pay | Admitting: Physical Medicine and Rehabilitation

## 2020-06-25 NOTE — Progress Notes (Signed)
-  Electrodiagnostic study demonstrates widespread axonal sensorimotor polyneuropathy.  Some changes noted with myopathic recruitment pattern in left biceps and fasciculations.  Differential diagnosis include autoimmune, inflammatory polyneuropathy.  Other considerations would include chronic sensory neuropathy with newer superimposed motor neuropathy.  We will proceed with lumbar puncture.  Then may consider second opinion neuromuscular clinic/academic center work-up.  Orders Placed This Encounter  Procedures  . DG FL GUIDED LUMBAR PUNCTURE    Suanne Marker, MD 06/25/2020, 5:54 PM Certified in Neurology, Neurophysiology and Neuroimaging  [facility] [address] [place], [place] [address] [phone]

## 2020-06-26 NOTE — Telephone Encounter (Signed)
Patient needed a release of information .

## 2020-07-03 ENCOUNTER — Other Ambulatory Visit: Payer: Self-pay | Admitting: Diagnostic Neuroimaging

## 2020-07-03 ENCOUNTER — Ambulatory Visit
Admission: RE | Admit: 2020-07-03 | Discharge: 2020-07-03 | Disposition: A | Discharge status: Home / Self Care | Payer: Medicare Other | Source: Ambulatory Visit | Attending: Diagnostic Neuroimaging | Admitting: Diagnostic Neuroimaging

## 2020-07-03 ENCOUNTER — Other Ambulatory Visit (HOSPITAL_COMMUNITY)
Admission: RE | Admit: 2020-07-03 | Discharge: 2020-07-03 | Disposition: A | Discharge status: Home / Self Care | Payer: Medicare Other | Source: Ambulatory Visit | Attending: Diagnostic Neuroimaging | Admitting: Diagnostic Neuroimaging

## 2020-07-03 ENCOUNTER — Telehealth: Payer: Self-pay | Admitting: Diagnostic Neuroimaging

## 2020-07-03 VITALS — BP 158/81 | HR 79

## 2020-07-03 DIAGNOSIS — G629 Polyneuropathy, unspecified: Secondary | ICD-10-CM | POA: Insufficient documentation

## 2020-07-03 DIAGNOSIS — R531 Weakness: Secondary | ICD-10-CM

## 2020-07-03 MED ORDER — IOPAMIDOL (ISOVUE-M 200) INJECTION 41%: 1.0000 mL | Freq: Once | INTRAMUSCULAR | Status: DC

## 2020-07-03 NOTE — Discharge Instructions (Signed)

## 2020-07-04 ENCOUNTER — Telehealth: Payer: Self-pay

## 2020-07-04 ENCOUNTER — Encounter: Payer: Self-pay | Admitting: *Deleted

## 2020-07-04 ENCOUNTER — Other Ambulatory Visit: Payer: Self-pay | Admitting: *Deleted

## 2020-07-04 DIAGNOSIS — G629 Polyneuropathy, unspecified: Secondary | ICD-10-CM

## 2020-07-04 DIAGNOSIS — R531 Weakness: Secondary | ICD-10-CM

## 2020-07-04 LAB — CYTOLOGY - NON PAP

## 2020-07-04 NOTE — Telephone Encounter (Signed)
Patients daughter called the triage phone stating that she needed to either speak with a Interior and spatial designer or with the referral coordinator about a referral that our office had neglected to send. I advised her that I would be happy to assist her with this. She proceeded to tell me that our office had neglected to send an urgent referral for her mom whose health was declining. 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 She stated that anyone could place a referral and that actually my little fingers could probably even put it into Epic if I wanted to. I asked her to please speak kindly and not condescending and assured her that as I had previously told her I would do my best to help take care of any issue for her mom. I recalled to her the notes in her moms chart from the prior mychart messages. Also advised that I needed clarification on what she was asking because it appeared (per the messages in the chart) that they had asked for this referral from several providers and we needed to clarify who it should come from.  After several minutes she decided that she no longer needed help from our office and that someone from another office would address this.

## 2020-07-07 LAB — CSF CULTURE W GRAM STAIN
MICRO NUMBER:: 10759612
Result:: NO GROWTH
SPECIMEN QUALITY:: ADEQUATE

## 2020-07-07 LAB — CSF CELL COUNT WITH DIFFERENTIAL
RBC Count, CSF: 2 cells/uL — ABNORMAL HIGH
WBC, CSF: 0 cells/uL (ref 0–5)

## 2020-07-07 LAB — GLUCOSE, CSF: Glucose, CSF: 70 mg/dL (ref 40–80)

## 2020-07-07 LAB — PROTEIN, CSF: Total Protein, CSF: 41 mg/dL (ref 15–60)

## 2020-07-18 DIAGNOSIS — G5622 Lesion of ulnar nerve, left upper limb: Secondary | ICD-10-CM | POA: Diagnosis not present

## 2020-07-18 DIAGNOSIS — G1220 Motor neuron disease, unspecified: Secondary | ICD-10-CM | POA: Diagnosis not present

## 2020-07-18 DIAGNOSIS — R531 Weakness: Secondary | ICD-10-CM | POA: Diagnosis not present

## 2020-07-18 DIAGNOSIS — G603 Idiopathic progressive neuropathy: Secondary | ICD-10-CM | POA: Diagnosis not present

## 2020-07-18 DIAGNOSIS — G1221 Amyotrophic lateral sclerosis: Secondary | ICD-10-CM | POA: Diagnosis not present

## 2020-07-18 DIAGNOSIS — R0602 Shortness of breath: Secondary | ICD-10-CM | POA: Diagnosis not present

## 2020-07-22 ENCOUNTER — Telehealth: Payer: Self-pay | Admitting: Plastic Surgery

## 2020-07-22 NOTE — Telephone Encounter (Signed)
Patient's daughter, Tamela Oddi, called to advise that she needs to speak with the Nurse about whether or not she needs to proceed with surgery due to medical condition. Advised her the PA may the best person to discuss concerns with.  Daughter didn't want to leave more information than that. She said she can be reached at [phone] or her mom can be called at [phone].

## 2020-07-22 NOTE — Telephone Encounter (Signed)
Carol Chavez will call to discuss. Will discuss this with patient at her pre-op appointment.

## 2020-07-23 DIAGNOSIS — R1312 Dysphagia, oropharyngeal phase: Secondary | ICD-10-CM | POA: Diagnosis not present

## 2020-07-23 DIAGNOSIS — G1220 Motor neuron disease, unspecified: Secondary | ICD-10-CM | POA: Diagnosis not present

## 2020-07-23 DIAGNOSIS — R633 Feeding difficulties: Secondary | ICD-10-CM | POA: Diagnosis not present

## 2020-07-23 DIAGNOSIS — R0602 Shortness of breath: Secondary | ICD-10-CM | POA: Diagnosis not present

## 2020-07-24 ENCOUNTER — Encounter: Payer: Medicare Other | Admitting: Surgical

## 2020-07-29 DIAGNOSIS — G1220 Motor neuron disease, unspecified: Secondary | ICD-10-CM | POA: Diagnosis not present

## 2020-07-29 DIAGNOSIS — R0689 Other abnormalities of breathing: Secondary | ICD-10-CM | POA: Diagnosis not present

## 2020-07-29 DIAGNOSIS — R1310 Dysphagia, unspecified: Secondary | ICD-10-CM | POA: Diagnosis not present

## 2020-07-29 DIAGNOSIS — R471 Dysarthria and anarthria: Secondary | ICD-10-CM | POA: Diagnosis not present

## 2020-07-29 DIAGNOSIS — R531 Weakness: Secondary | ICD-10-CM | POA: Diagnosis not present

## 2020-08-06 ENCOUNTER — Emergency Department (HOSPITAL_COMMUNITY): Payer: Medicare Other

## 2020-08-06 ENCOUNTER — Inpatient Hospital Stay (HOSPITAL_COMMUNITY)
Admission: EM | Admit: 2020-08-06 | Discharge: 2020-08-09 | DRG: 189 | Disposition: A | Discharge status: Hospice | Payer: Medicare Other | Attending: Internal Medicine | Admitting: Internal Medicine

## 2020-08-06 ENCOUNTER — Encounter (HOSPITAL_COMMUNITY): Payer: Self-pay | Admitting: Emergency Medicine

## 2020-08-06 ENCOUNTER — Other Ambulatory Visit: Payer: Self-pay

## 2020-08-06 ENCOUNTER — Telehealth: Payer: Self-pay | Admitting: *Deleted

## 2020-08-06 DIAGNOSIS — E785 Hyperlipidemia, unspecified: Secondary | ICD-10-CM | POA: Diagnosis present

## 2020-08-06 DIAGNOSIS — E559 Vitamin D deficiency, unspecified: Secondary | ICD-10-CM | POA: Diagnosis present

## 2020-08-06 DIAGNOSIS — R0602 Shortness of breath: Secondary | ICD-10-CM | POA: Diagnosis not present

## 2020-08-06 DIAGNOSIS — R911 Solitary pulmonary nodule: Secondary | ICD-10-CM | POA: Diagnosis present

## 2020-08-06 DIAGNOSIS — J9622 Acute and chronic respiratory failure with hypercapnia: Secondary | ICD-10-CM

## 2020-08-06 DIAGNOSIS — J80 Acute respiratory distress syndrome: Secondary | ICD-10-CM | POA: Diagnosis not present

## 2020-08-06 DIAGNOSIS — K219 Gastro-esophageal reflux disease without esophagitis: Secondary | ICD-10-CM | POA: Diagnosis present

## 2020-08-06 DIAGNOSIS — J9811 Atelectasis: Secondary | ICD-10-CM | POA: Diagnosis present

## 2020-08-06 DIAGNOSIS — Z66 Do not resuscitate: Secondary | ICD-10-CM | POA: Diagnosis present

## 2020-08-06 DIAGNOSIS — G629 Polyneuropathy, unspecified: Secondary | ICD-10-CM | POA: Diagnosis not present

## 2020-08-06 DIAGNOSIS — Z515 Encounter for palliative care: Secondary | ICD-10-CM | POA: Diagnosis not present

## 2020-08-06 DIAGNOSIS — Z20822 Contact with and (suspected) exposure to covid-19: Secondary | ICD-10-CM | POA: Diagnosis not present

## 2020-08-06 DIAGNOSIS — J9621 Acute and chronic respiratory failure with hypoxia: Principal | ICD-10-CM

## 2020-08-06 DIAGNOSIS — R0902 Hypoxemia: Secondary | ICD-10-CM | POA: Diagnosis not present

## 2020-08-06 DIAGNOSIS — Z791 Long term (current) use of non-steroidal anti-inflammatories (NSAID): Secondary | ICD-10-CM | POA: Diagnosis not present

## 2020-08-06 DIAGNOSIS — R0689 Other abnormalities of breathing: Secondary | ICD-10-CM | POA: Diagnosis not present

## 2020-08-06 DIAGNOSIS — Z79899 Other long term (current) drug therapy: Secondary | ICD-10-CM | POA: Diagnosis not present

## 2020-08-06 DIAGNOSIS — Z7189 Other specified counseling: Secondary | ICD-10-CM

## 2020-08-06 DIAGNOSIS — R42 Dizziness and giddiness: Secondary | ICD-10-CM | POA: Diagnosis not present

## 2020-08-06 DIAGNOSIS — G1221 Amyotrophic lateral sclerosis: Secondary | ICD-10-CM | POA: Diagnosis present

## 2020-08-06 DIAGNOSIS — G9341 Metabolic encephalopathy: Secondary | ICD-10-CM | POA: Diagnosis not present

## 2020-08-06 DIAGNOSIS — I452 Bifascicular block: Secondary | ICD-10-CM | POA: Diagnosis not present

## 2020-08-06 DIAGNOSIS — J9601 Acute respiratory failure with hypoxia: Secondary | ICD-10-CM

## 2020-08-06 DIAGNOSIS — R079 Chest pain, unspecified: Secondary | ICD-10-CM | POA: Diagnosis not present

## 2020-08-06 DIAGNOSIS — Z803 Family history of malignant neoplasm of breast: Secondary | ICD-10-CM | POA: Diagnosis not present

## 2020-08-06 LAB — I-STAT ARTERIAL BLOOD GAS, ED
Acid-Base Excess: 12 mmol/L — ABNORMAL HIGH (ref 0.0–2.0)
Bicarbonate: 43.5 mmol/L — ABNORMAL HIGH (ref 20.0–28.0)
Calcium, Ion: 1.31 mmol/L (ref 1.15–1.40)
HCT: 46 % (ref 36.0–46.0)
Hemoglobin: 15.6 g/dL — ABNORMAL HIGH (ref 12.0–15.0)
O2 Saturation: 98 %
Patient temperature: 98.8
Potassium: 3.2 mmol/L — ABNORMAL LOW (ref 3.5–5.1)
Sodium: 142 mmol/L (ref 135–145)
TCO2: 46 mmol/L — ABNORMAL HIGH (ref 22–32)
pCO2 arterial: 87.2 mmHg (ref 32.0–48.0)
pH, Arterial: 7.307 — ABNORMAL LOW (ref 7.350–7.450)
pO2, Arterial: 127 mmHg — ABNORMAL HIGH (ref 83.0–108.0)

## 2020-08-06 LAB — BASIC METABOLIC PANEL
Anion gap: 10 (ref 5–15)
BUN: 15 mg/dL (ref 8–23)
CO2: 37 mmol/L — ABNORMAL HIGH (ref 22–32)
Calcium: 9.6 mg/dL (ref 8.9–10.3)
Chloride: 97 mmol/L — ABNORMAL LOW (ref 98–111)
Creatinine, Ser: 0.5 mg/dL (ref 0.44–1.00)
GFR calc Af Amer: >60 mL/min (ref >60)
GFR calc non Af Amer: >60 mL/min (ref >60)
Glucose, Bld: 120 mg/dL — ABNORMAL HIGH (ref 70–99)
Potassium: 3.6 mmol/L (ref 3.5–5.1)
Sodium: 144 mmol/L (ref 135–145)

## 2020-08-06 LAB — CBC WITH DIFFERENTIAL/PLATELET
Abs Immature Granulocytes: 0.04 10*3/uL (ref 0.00–0.07)
Basophils Absolute: 0 10*3/uL (ref 0.0–0.1)
Basophils Relative: 0 %
Eosinophils Absolute: 0 10*3/uL (ref 0.0–0.5)
Eosinophils Relative: 0 %
HCT: 48 % — ABNORMAL HIGH (ref 36.0–46.0)
Hemoglobin: 15.3 g/dL — ABNORMAL HIGH (ref 12.0–15.0)
Immature Granulocytes: 0 %
Lymphocytes Relative: 19 %
Lymphs Abs: 1.9 10*3/uL (ref 0.7–4.0)
MCH: 30.8 pg (ref 26.0–34.0)
MCHC: 31.9 g/dL (ref 30.0–36.0)
MCV: 96.6 fL (ref 80.0–100.0)
Monocytes Absolute: 0.8 10*3/uL (ref 0.1–1.0)
Monocytes Relative: 8 %
Neutro Abs: 7 10*3/uL (ref 1.7–7.7)
Neutrophils Relative %: 73 %
Platelets: 279 10*3/uL (ref 150–400)
RBC: 4.97 MIL/uL (ref 3.87–5.11)
RDW: 12.8 % (ref 11.5–15.5)
WBC: 9.8 10*3/uL (ref 4.0–10.5)
nRBC: 0 % (ref 0.0–0.2)

## 2020-08-06 LAB — TROPONIN I (HIGH SENSITIVITY)
Troponin I (High Sensitivity): 14 ng/L (ref <18)
Troponin I (High Sensitivity): 20 ng/L — ABNORMAL HIGH (ref <18)

## 2020-08-06 LAB — SARS CORONAVIRUS 2 BY RT PCR (HOSPITAL ORDER, PERFORMED IN ~~LOC~~ HOSPITAL LAB): SARS Coronavirus 2: NEGATIVE

## 2020-08-06 MED ORDER — LACTATED RINGERS IV SOLN: INTRAVENOUS | Status: DC

## 2020-08-06 MED ORDER — ACETAMINOPHEN 650 MG RE SUPP: 650.0000 mg | Freq: Four times a day (QID) | RECTAL | Status: DC | PRN

## 2020-08-06 MED ORDER — FENTANYL CITRATE (PF) 100 MCG/2ML IJ SOLN: 12.5000 ug | INTRAMUSCULAR | Status: DC | PRN

## 2020-08-06 MED ORDER — ONDANSETRON HCL 4 MG/2ML IJ SOLN: 4.0000 mg | Freq: Four times a day (QID) | INTRAMUSCULAR | Status: DC | PRN

## 2020-08-06 MED ORDER — SODIUM CHLORIDE 0.9% FLUSH
3.0000 mL | Freq: Two times a day (BID) | INTRAVENOUS | Status: DC
  Administered 2020-08-07: 3 mL via INTRAVENOUS

## 2020-08-06 MED ORDER — IOHEXOL 350 MG/ML SOLN
75.0000 mL | Freq: Once | INTRAVENOUS | Status: AC | PRN
  Administered 2020-08-06: 75 mL via INTRAVENOUS

## 2020-08-06 MED ORDER — ACETAMINOPHEN 325 MG PO TABS: 650.0000 mg | ORAL_TABLET | Freq: Four times a day (QID) | ORAL | Status: DC | PRN

## 2020-08-06 MED ORDER — ONDANSETRON HCL 4 MG PO TABS: 4.0000 mg | ORAL_TABLET | Freq: Four times a day (QID) | ORAL | Status: DC | PRN

## 2020-08-06 NOTE — ED Provider Notes (Addendum)
[facility] EMERGENCY DEPARTMENT Provider Note   CSN: [id number] Arrival date & time: 08/06/20  1652     History Chief Complaint  Patient presents with   Shortness of Breath    Carol Chavez is a 74 y.o. female with a past medical history of GERD, recent diagnosis of ALS presenting to the ED with a chief complaint of hypoxia.  Patient was prescribed a BiPAP machine by her pulmonologist yesterday.  She was attempting to use this machine today and found to have oxygen saturations of 85% on room air.  She was evaluated by EMS who consulted her pulmonologist.  They were trying to get her a prescription for oxygen but were unable to do so.  Pulmonologist recommended come to the ER to rule out a PE.  Patient states that she feels at her baseline.  She did feel slightly short of breath and anxious when attempting to use the machine but otherwise feels fine.  Denies any shortness of breath at present.  Denies any chest pain, cough, fever, abdominal pain, vomiting or other chronic lung disease.  No leg swelling, history of DVT or PE.  Denies any injuries or falls.  HPI     Past Medical History:  Diagnosis Date   Balance problem    Foot drop, left    GERD (gastroesophageal reflux disease)    Memory changes    pt denies   Vitamin D deficiency    pt denies    Patient Active Problem List   Diagnosis Date Noted   Left hand pain 04/23/2020   Muscle cramps 04/23/2020   Globus sensation 04/23/2020   Anxiety 04/23/2020   H/O bilateral breast implants 04/23/2020   Tremor 04/02/2020   Liver lesion 02/22/2020   Lesion of spleen 02/22/2020   Dizziness 02/20/2020   Neuropathy 02/20/2020   Weakness of foot, left 02/20/2020   Nausea 02/20/2020   Chronic cough 12/20/2019   Chronic bilateral low back pain with left-sided sciatica 12/20/2019   Routine general medical examination at a health care facility 07/27/2016   Hyponatremia 04/26/2016   Prediabetes  04/26/2016   Encounter for Medicare annual wellness exam 06/13/2013   Colon cancer screening 06/13/2013   Hyperlipidemia 07/25/2009   VAGINITIS, ATROPHIC, POSTMENOPAUSAL 07/25/2009    Past Surgical History:  Procedure Laterality Date   APPENDECTOMY     CESAREAN SECTION     CYST EXCISION     pilonidal   PLACEMENT OF BREAST IMPLANTS  1988     OB History   No obstetric history on file.     Family History  Problem Relation Age of Onset   Breast cancer Mother     Social History   Tobacco Use   Smoking status: Never Smoker   Smokeless tobacco: Never Used  Substance Use Topics   Alcohol use: Yes    Comment: occ   Drug use: No    Home Medications Prior to Admission medications   Medication Sig Start Date End Date Taking? Authorizing Provider  Cholecalciferol (VITAMIN D PO) Take 3 capsules by mouth daily.    Yes [provider]  ibuprofen (ADVIL) 200 MG tablet Take 600 mg by mouth 2 (two) times daily.   Yes [provider]  MAGNESIUM PO Take 2 tablets by mouth 2 (two) times daily.    Yes [provider]  riluzole (RILUTEK) 50 MG tablet Take 50 mg by mouth 2 (two) times daily. 07/30/20  Yes [provider]  VITAMIN E PO  Take 1 tablet by mouth daily.   Yes [provider]  busPIRone (BUSPAR) 15 MG tablet Take 0.5 tablets (7.5 mg total) by mouth 2 (two) times daily. Patient not taking: Reported on 08/06/2020 04/23/20   Tower, Audrie Gallus, MD  diazepam (VALIUM) 5 MG tablet 1 PO 1 hour before MRI, repeat prn Patient not taking: Reported on 08/06/2020 03/22/20   Hilts, Casimiro Needle, MD  estradiol (ESTRACE VAGINAL) 0.1 MG/GM vaginal cream Use a small amount (1-2 cm) intravaginally twice weekly Patient not taking: Reported on 08/06/2020 10/05/19   Tower, Audrie Gallus, MD  gabapentin (NEURONTIN) 100 MG capsule 1 PO q HS, may increase to 3 PO q HS if needed/tolerated Patient not taking: Reported on 05/20/2020 11/27/19   Hilts, Casimiro Needle, MD    omeprazole (PRILOSEC) 20 MG capsule Take 1 capsule (20 mg total) by mouth 2 (two) times daily before a meal. Patient not taking: Reported on 08/06/2020 04/23/20   Tower, Audrie Gallus, MD  ondansetron (ZOFRAN ODT) 4 MG disintegrating tablet Take 1 tablet (4 mg total) by mouth every 8 (eight) hours as needed for nausea or vomiting. Patient not taking: Reported on 08/06/2020 02/08/20   Tegeler, Canary Brim, MD  terbinafine (LAMISIL) 250 MG tablet Take 1 tablet (250 mg total) by mouth daily. Patient not taking: Reported on 08/06/2020 05/03/20   Hilts, Casimiro Needle, MD    Allergies    Patient has no known allergies.  Review of Systems   Review of Systems  Constitutional: Negative for appetite change, chills and fever.  HENT: Negative for ear pain, rhinorrhea, sneezing and sore throat.   Eyes: Negative for photophobia and visual disturbance.  Respiratory: Positive for shortness of breath. Negative for cough, chest tightness and wheezing.   Cardiovascular: Negative for chest pain and palpitations.  Gastrointestinal: Negative for abdominal pain, blood in stool, constipation, diarrhea, nausea and vomiting.  Genitourinary: Negative for dysuria, hematuria and urgency.  Musculoskeletal: Negative for myalgias.  Skin: Negative for rash.  Neurological: Negative for dizziness, weakness and light-headedness.    Physical Exam Updated Vital Signs BP (!) 152/82    Pulse 96    Temp 98.8 F (37.1 C) (Oral)    Resp (!) 21    SpO2 99%   Physical Exam Vitals and nursing note reviewed.  Constitutional:      General: She is not in acute distress.    Appearance: She is well-developed.     Comments: 2L of oxygen via nasal cannula.  HENT:     Head: Normocephalic and atraumatic.     Nose: Nose normal.  Eyes:     General: No scleral icterus.       Left eye: No discharge.     Conjunctiva/sclera: Conjunctivae normal.  Cardiovascular:     Rate and Rhythm: Normal rate and regular rhythm.     Heart sounds: Normal heart  sounds. No murmur heard.  No friction rub. No gallop.   Pulmonary:     Effort: Pulmonary effort is normal. Tachypnea present. No respiratory distress.     Breath sounds: Normal breath sounds.  Abdominal:     General: Bowel sounds are normal. There is no distension.     Palpations: Abdomen is soft.     Tenderness: There is no abdominal tenderness. There is no guarding.  Musculoskeletal:        General: Normal range of motion.     Cervical back: Normal range of motion and neck supple.     Comments: L drop foot.  Skin:  General: Skin is warm and dry.     Findings: No rash.  Neurological:     Mental Status: She is alert.     Motor: No abnormal muscle tone.     Coordination: Coordination normal.     ED Results / Procedures / Treatments   Labs (all labs ordered are listed, but only abnormal results are displayed) Labs Reviewed  BASIC METABOLIC PANEL - Abnormal; Notable for the following components:      Result Value   Chloride 97 (*)    CO2 37 (*)    Glucose, Bld 120 (*)    All other components within normal limits  CBC WITH DIFFERENTIAL/PLATELET - Abnormal; Notable for the following components:   Hemoglobin 15.3 (*)    HCT 48.0 (*)    All other components within normal limits  SARS CORONAVIRUS 2 BY RT PCR (HOSPITAL ORDER, PERFORMED IN [place] [facility])  I-STAT ARTERIAL BLOOD GAS, ED  TROPONIN I (HIGH SENSITIVITY)  TROPONIN I (HIGH SENSITIVITY)    EKG EKG Interpretation  Date/Time:  Tuesday August 06 2020 18:36:14 EDT Ventricular Rate:  90 PR Interval:    QRS Duration: 126 QT Interval:  391 QTC Calculation: 479 R Axis:   -61 Text Interpretation: Sinus rhythm Biatrial enlargement RBBB and LAFB When compared to prior, similar appearance. No STEMI Confirmed by Theda Belfast ([id number]) on 08/06/2020 8:14:13 PM   Radiology CT Angio Chest PE W/Cm &/Or Wo Cm  Result Date: 08/06/2020 CLINICAL DATA:  Shortness of breath and chest pain EXAM: CT ANGIOGRAPHY CHEST WITH  CONTRAST TECHNIQUE: Multidetector CT imaging of the chest was performed using the standard protocol during bolus administration of intravenous contrast. Multiplanar CT image reconstructions and MIPs were obtained to evaluate the vascular anatomy. CONTRAST:  47mL OMNIPAQUE IOHEXOL 350 MG/ML SOLN COMPARISON:  08/06/2020 FINDINGS: Cardiovascular: This is a technically adequate evaluation of the pulmonary vasculature. No filling defects or pulmonary emboli. The heart is unremarkable without pericardial effusion. No evidence of thoracic aortic aneurysm or dissection. Minimal atherosclerosis of the descending thoracic aorta. Mediastinum/Nodes: Incidental 2.6 x 1.3 cm hypodense nodule right lobe thyroid. The trachea and esophagus are unremarkable. No pathologic adenopathy. Lungs/Pleura: Scattered areas of atelectasis are seen within the dependent lower lobes. No acute airspace disease, effusion, or pneumothorax. 4 mm subpleural right upper lobe pulmonary nodule reference image 47 of series 6. No other pulmonary nodules or masses. Mild upper lobe predominant emphysema. Central airways are patent. Upper Abdomen: No acute abnormality. Musculoskeletal: No acute or destructive bony lesions. Hemangioma again noted within the T10 vertebral body. Bilateral breast prostheses are identified, with heavily calcified capsules. There is evidence of chronic intracapsular rupture on the right. Reconstructed images demonstrate no additional findings. Review of the MIP images confirms the above findings. IMPRESSION: 1. No evidence of pulmonary embolus. 2. Scattered bibasilar atelectasis without acute airspace disease. 3. 4 mm right upper lobe pulmonary nodule. No follow-up needed if patient is low-risk. Non-contrast chest CT can be considered in 12 months if patient is high-risk. This recommendation follows the consensus statement: Guidelines for Management of Incidental Pulmonary Nodules Detected on CT Images: From the Fleischner Society  2017; Radiology 2017; 284:228-243. 4. 2.6 cm right lobe thyroid nodule. Recommend thyroid US if not previously evaluated. (Ref: J Am Coll Radiol. 2015 Feb;12(2): 143-50). 5. Aortic Atherosclerosis (ICD10-I70.0) and Emphysema (ICD10-J43.9). Electronically Signed   By: Sharlet Salina M.D.   On: 08/06/2020 19:57   DG Chest Portable 1 View  Result Date: 08/06/2020 CLINICAL DATA:  Shortness of breath and chest pain. EXAM: PORTABLE CHEST 1 VIEW COMPARISON:  None. FINDINGS: There is no evidence of acute infiltrate, pleural effusion or pneumothorax. The heart size and mediastinal contours are within normal limits. Partially calcified bilateral breast implants are seen. The visualized skeletal structures are unremarkable. IMPRESSION: No active disease. Electronically Signed   By: Aram Candelahaddeus  Houston M.D.   On: 08/06/2020 17:56    Procedures .Critical Care Performed by: Dietrich PatesKhatri, Kaleia Longhi, PA-C Authorized by: Dietrich PatesKhatri, Tyreck Bell, PA-C   Critical care provider statement:    Critical care time (minutes):  45   Critical care was necessary to treat or prevent imminent or life-threatening deterioration of the following conditions:  Cardiac failure, respiratory failure and circulatory failure   Critical care was time spent personally by me on the following activities:  Development of treatment plan with patient or surrogate, evaluation of patient's response to treatment, ordering and performing treatments and interventions, ordering and review of laboratory studies, ordering and review of radiographic studies, pulse oximetry, re-evaluation of patient's condition, review of old charts, obtaining history from patient or surrogate, examination of patient and discussions with consultants   I assumed direction of critical care for this patient from another provider in my specialty: no     (including critical care time)  Medications Ordered in ED Medications  iohexol (OMNIPAQUE) 350 MG/ML injection 75 mL (75 mLs Intravenous  Contrast Given 08/06/20 1948)    ED Course  I have reviewed the triage vital signs and the nursing notes.  Pertinent labs & imaging results that were available during my care of the patient were reviewed by me and considered in my medical decision making (see chart for details).  Clinical Course as of Aug 06 2199  Tue Aug 06, 2020  2103 NIF <10   [HK]  2117 Spoke to on-call critical care. Reviewed lab work, NIF score, patient on BiPAP right now and I told him settings.  He feels that patient can be admitted to medicine service was consulted critical care if need be.   [HK]  2118 pCO2 arterial(!!): 87.2 [HK]    Clinical Course User Index [HK] Dietrich PatesKhatri, Ifeoluwa Beller, PA-C   MDM Rules/Calculators/A&P                          74 year old female with a past medical history of GERD, recent diagnosis of ALS 2 weeks ago presenting to the ED with chief complaint of hypoxia and shortness of breath.  Prescribed portable BiPAP ventilator by pulmonologist yesterday but was too anxious to use it today.  Husband found oxygen saturations of 85% on room air and called EMS.  EMS consulted her pulmonologist try to get a prescription for supplemental oxygen but was unable to do so for the next 2 days and was sent to the ER to rule out a PE.  Initially on exam patient on 2 L of oxygen via nasal cannula with good improvement in oxygen saturations to 100%.  She does have some tachypnea noted and does appear tired but no neurological deficits noted.  Progressively patient with increased work of breathing and placed on BiPAP subsequently.  Chest x-ray and CT angio of the chest without any acute findings, there are no evidence of PE but does have incidental findings of pulmonary nodules.  EKG shows sinus rhythm, no changes from prior tracings, no STEMI.  BMP, CBC initial troponin are unremarkable.  ABG is pCO2 of 87.  Consulted critical care and reviewed patient's  history, BiPAP settings and current symptoms.  Feel that she can be  admitted to hospitalist service for critical care consult as needed.  I did attempt to have a conversation with the husband about patient's CODE STATUS, if they are comfortable with her being intubated as this may be the next step.  Husband states that they have not discussed this so far as her diagnosis is recent and unfortunately her symptoms have rapidly progressed.  He has not made a decision at this time.  Nevertheless we will need to admit her to medicine service for ongoing management of her cute respiratory failure secondary to her ALS.  10:00 PM Reconsulted critical care regarding patient's ABG findings.  They will admit the patient.   Portions of this note were generated with Scientist, clinical (histocompatibility and immunogenetics). Dictation errors may occur despite best attempts at proofreading.  Final Clinical Impression(s) / ED Diagnoses Final diagnoses:  Acute respiratory failure with hypoxia (HCC)  ALS (amyotrophic lateral sclerosis) [facility])    Rx / DC Orders ED Discharge Orders    None         Dietrich Pates, PA-C 08/06/20 2302    Tegeler, Canary Brim, MD 08/07/20 1154

## 2020-08-06 NOTE — ED Notes (Signed)
Pt on at home Trilogy machine, pt tolerating well, SPO2 100%, no noted increase work of breathing at this time. Pt able to speak in full sentences and respond appropriately at this time. Spouse at bedside educated on oral care and demonstrated for this RN.

## 2020-08-06 NOTE — ED Notes (Signed)
BiPAP has been removed at this time, no increased work of breathing noted at this time, pt 5L O2 [place] 100% SPO2. Swallow screening performed, pt unable to seal lips and suck from straw, Tegeler, MD made aware. Oral care provided for pt due to noted dry mucus membranes.

## 2020-08-06 NOTE — Telephone Encounter (Signed)
Patient's husband called stating that his wife was diagnosed with ALS two weeks ago. Patient's husband stated that he had to call EMS because her oxygen level dropped to 85%. Patient's husband stated that they need oxygen for his wife. Patient's husband stated that her ALS doctor prescribed a bi-pap machine and they need oxygen to use with her oxygen level dropping. Olegario Messier EMS responder got on the phone and wanted to know if Dr. Milinda Antis can order oxygen for the patient to use. Placed Olegario Messier on hold. After speaking to Dr. Milinda Antis, Olegario Messier was advisedt they need to reach out to the doctor that ordered the bi-pap machine because they would know how to regulate the oxygen and the settings. Olegario Messier stated that she will have the patient's husband contact the ALS doctor.

## 2020-08-06 NOTE — H&P (Signed)
NAME:  Carol Chavez, MRN:  [id number], DOB:  09/06/1946, LOS: 0 ADMISSION DATE:  08/06/2020, CONSULTATION DATE: 08/06/2020 REFERRING MD: Dr. Rush Landmark, CHIEF COMPLAINT: Hypercapnic respiratory failure  Brief History   This is a 74 year old female diagnosis of ALS.  Admitted to the emergency department and respiratory failure.  Patient is followed at [facility].  History of present illness   74 year old female past medical history gastroesophageal reflux disease recent diagnosis of ALS recently placed on BiPAP by pulmonologist at [facility].  Patient was found to be hypoxemic at home.  Patient was brought to the emergency room to rule out PE.  CTA negative however does have basilar atelectasis consistent with hypoventilation.  Also found to have an elevated PCO2.  With new hypoxemic and hypercarbic respiratory failure.  She was placed on BiPAP.  History obtained from patient's husband at bedside.  Recent diagnosis as of the past 3 weeks of ALS.  She has had significant decline over the past several weeks to months.  We had a telephone call with patient's son who is a International aid/development worker, daughter who is an ICU nurse as well as patient's husband all at bedside.  We discussed patient's lab work her current clinical state.  The fact that her respiratory status has significantly declined.  We discussed the potential need for mechanical ventilation.  They have talked amongst themselves and have agreed that that would not be appropriate in mother's case.  They would like to have palliative care involved with potential for transition home with hospice care if possible.  Past Medical History   Past Medical History:  Diagnosis Date  . Balance problem   . Foot drop, left   . GERD (gastroesophageal reflux disease)   . Memory changes    pt denies  . Vitamin D deficiency    pt denies     Significant Hospital Events     Consults:    Procedures:    Significant Diagnostic Tests:   07/23/2020: FVC  PRE 1.33 (L) 1.89 - 3.38 L [facility] RAD   FEV1 PRE 1.12 (L) 1.45 - 2.60 L [facility] RAD   FEV1/FVC PRE 84.69 63.66 - 91.59 % [facility]   FEV6 PRE 1.32 (L) 1.98 - 3.31 L [facility]   FEV1/FEV6 PRE 85.27 69.74 - 87.34 % [facility]   FEF25-75% PRE 2.35 0.74 - 2.62 L/s [facility]   ISOFEF25-75 PRE 2.35 L/s [facility]   FEF50% PRE 3.10 1.45 - 5.06 L/s [facility]   PEF PRE 3.45 (L) 3.56 - 6.91 L/s [facility]   FET100% Change 7.47 sec [facility]   FIVC PRE 0.06 (L) 1.89 - 3.38 L [facility]   Vol extrap pre 0.12 L [facility] RAD      Micro Data:  COVID-19 negative  Antimicrobials:     Interim history/subjective:  Per HPI above.  Objective   Blood pressure 132/80, pulse 99, temperature 98.8 F (37.1 C), temperature source Oral, resp. rate 17, SpO2 100 %.    Vent Mode: CPAP;PSV FiO2 (%):  [40 %] 40 % PEEP:  [5 cmH20] 5 cmH20 Pressure Support:  [10 cmH20] 10 cmH20   Intake/Output Summary (Last 24 hours) at 08/06/2020 2221 Last data filed at 08/06/2020 2149 Gross per 24 hour  Intake --  Output 200 ml  Net -200 ml   There were no vitals filed for this visit.  Examination: General: Elderly frail female, muscle wasting present HENT: NCAT, shallow respirations accessory muscle use Lungs: Shallow respirations,  accessory muscle use, crackles in the bases Cardiovascular: Regular rate rhythm, S1-S2 Abdomen: Soft nontender nondistended Extremities: No significant edema Neuro:elicit bilateral ankle clonus GU: Deferred  Resolved Hospital Problem list    Assessment & Plan:   Acute hypoxemic hypercarbic respiratory failure in the setting of hypoventilation from advanced neuromuscular disease, amyotrophic lateral sclerosis. Plan: Discussion with patient's family and husband at bedside. There well aware of the patient's illness as well as respiratory decline which will likely lead to patient's death. Reviewed that DNR status is most appropriate. Emergency department has already consulted palliative care. Continue  use of NIPPV for treatment of hypercapnic failure as well as comfort. Patient appears more comfortable on her home trilogy device instead of BiPAP. We will admit to palliative care floor I will call the hospitalist to see if we can have medicine service pick up patient tomorrow morning. If patient has continued respiratory decline we may need to consider inpatient transition to comfort care measures.  However patient's family would prefer to try to make it home if at all possible.  She does not appear to be in any pain at this time.  Acute metabolic encephalopathy CO2 narcosis secondary to above -Continue NIPPV.  Goals of care -Palliative care consulted -May need inpatient comfort care transition.  Of note: Patient's husband is a former Orthoptist here at [employer].  Labs   CBC: Recent Labs  Lab 08/06/20 1811 08/06/20 2125  WBC 9.8  --   NEUTROABS 7.0  --   HGB 15.3* 15.6*  HCT 48.0* 46.0  MCV 96.6  --   PLT 279  --     Basic Metabolic Panel: Recent Labs  Lab 08/06/20 1811 08/06/20 2125  NA 144 142  K 3.6 3.2*  CL 97*  --   CO2 37*  --   GLUCOSE 120*  --   BUN 15  --   CREATININE 0.50  --   CALCIUM 9.6  --    GFR: CrCl cannot be calculated (Unknown ideal weight.). Recent Labs  Lab 08/06/20 1811  WBC 9.8    Liver Function Tests: No results for input(s): AST, ALT, ALKPHOS, BILITOT, PROT, ALBUMIN in the last 168 hours. No results for input(s): LIPASE, AMYLASE in the last 168 hours. No results for input(s): AMMONIA in the last 168 hours.  ABG    Component Value Date/Time   PHART 7.307 (L) 08/06/2020 2125   PCO2ART 87.2 (HH) 08/06/2020 2125   PO2ART 127 (H) 08/06/2020 2125   HCO3 43.5 (H) 08/06/2020 2125   TCO2 46 (H) 08/06/2020 2125   O2SAT 98.0 08/06/2020 2125     Coagulation Profile: No results for input(s): INR, PROTIME in the last 168 hours.  Cardiac Enzymes: No results for input(s): CKTOTAL, CKMB, CKMBINDEX, TROPONINI in the last 168  hours.  HbA1C: Hgb A1c MFr Bld  Date/Time Value Ref Range Status  05/20/2020 12:12 PM 5.7 (H) 4.8 - 5.6 % Final    Comment:             Prediabetes: 5.7 - 6.4          Diabetes: >6.4          Glycemic control for adults with diabetes: <7.0   10/05/2019 10:39 AM 5.8 4.6 - 6.5 % Final    Comment:    Glycemic Control Guidelines for People with Diabetes:Non Diabetic:  <6%Goal of Therapy: <7%Additional Action Suggested:  >8%     CBG: No results for input(s): GLUCAP in the last 168 hours.  Review of Systems:   Unable to be obtained secondary to encephalopathy.  Past Medical History  She,  has a past medical history of Balance problem, Foot drop, left, GERD (gastroesophageal reflux disease), Memory changes, and Vitamin D deficiency.   Surgical History    Past Surgical History:  Procedure Laterality Date  . APPENDECTOMY    . CESAREAN SECTION    . CYST EXCISION     pilonidal  . PLACEMENT OF BREAST IMPLANTS  1988     Social History   reports that she has never smoked. She has never used smokeless tobacco. She reports current alcohol use. She reports that she does not use drugs.   Family History   Her family history includes Breast cancer in her mother.   Allergies No Known Allergies   Home Medications  Prior to Admission medications   Medication Sig Start Date End Date Taking? Authorizing Provider  Cholecalciferol (VITAMIN D PO) Take 3 capsules by mouth daily.    Yes [provider]  ibuprofen (ADVIL) 200 MG tablet Take 600 mg by mouth 2 (two) times daily.   Yes [provider]  MAGNESIUM PO Take 2 tablets by mouth 2 (two) times daily.    Yes [provider]  riluzole (RILUTEK) 50 MG tablet Take 50 mg by mouth 2 (two) times daily. 07/30/20  Yes [provider]  VITAMIN E PO Take 1 tablet by mouth daily.   Yes [provider]  busPIRone (BUSPAR) 15 MG tablet Take 0.5 tablets (7.5 mg total) by mouth 2 (two) times daily. Patient not  taking: Reported on 08/06/2020 04/23/20   Tower, Audrie Gallus, MD  diazepam (VALIUM) 5 MG tablet 1 PO 1 hour before MRI, repeat prn Patient not taking: Reported on 08/06/2020 03/22/20   Hilts, Casimiro Needle, MD  estradiol (ESTRACE VAGINAL) 0.1 MG/GM vaginal cream Use a small amount (1-2 cm) intravaginally twice weekly Patient not taking: Reported on 08/06/2020 10/05/19   Tower, Audrie Gallus, MD  gabapentin (NEURONTIN) 100 MG capsule 1 PO q HS, may increase to 3 PO q HS if needed/tolerated Patient not taking: Reported on 05/20/2020 11/27/19   Hilts, Casimiro Needle, MD  omeprazole (PRILOSEC) 20 MG capsule Take 1 capsule (20 mg total) by mouth 2 (two) times daily before a meal. Patient not taking: Reported on 08/06/2020 04/23/20   Tower, Audrie Gallus, MD  ondansetron (ZOFRAN ODT) 4 MG disintegrating tablet Take 1 tablet (4 mg total) by mouth every 8 (eight) hours as needed for nausea or vomiting. Patient not taking: Reported on 08/06/2020 02/08/20   Tegeler, Canary Brim, MD  terbinafine (LAMISIL) 250 MG tablet Take 1 tablet (250 mg total) by mouth daily. Patient not taking: Reported on 08/06/2020 05/03/20   Hilts, Casimiro Needle, MD    This patient is critically ill with multiple organ system failure; which, requires frequent high complexity decision making, assessment, support, evaluation, and titration of therapies. This was completed through the application of advanced monitoring technologies and extensive interpretation of multiple databases. During this encounter critical care time was devoted to patient care services described in this note for 48 minutes.  Josephine Igo, DO [facility] 08/06/2020 10:21 PM

## 2020-08-06 NOTE — Progress Notes (Signed)
RT setup and placed patient on home Trilogy with 4L bleed in O2.  Patient vitals stable.  RT will continue to monitor.

## 2020-08-06 NOTE — ED Notes (Signed)
RT at bedside at this time, post call per Tegeler, MD for bipap, pt has increase work of breathing and use of accessory muscles noted on 4L O2 99%. Pt able to speak in full sentences.

## 2020-08-06 NOTE — Progress Notes (Signed)
NIF -10   Poor patient effort.

## 2020-08-06 NOTE — ED Triage Notes (Signed)
Pt BIB [facility] from home. EMS called out for shortness of breath. Pt SpO2 86% RA upon EMS arrival. 2L [place] pt 96%. Upon ALS and Pulmonologist request pt brought to ED for rule out of PE. VSS. NAD.

## 2020-08-07 LAB — BLOOD GAS, ARTERIAL
Acid-Base Excess: 17 mmol/L — ABNORMAL HIGH (ref 0.0–2.0)
Bicarbonate: 44.2 mmol/L — ABNORMAL HIGH (ref 20.0–28.0)
FIO2: 36
O2 Saturation: 97.7 %
Patient temperature: 36.3
pCO2 arterial: 91.7 mmHg (ref 32.0–48.0)
pH, Arterial: 7.3 — ABNORMAL LOW (ref 7.350–7.450)
pO2, Arterial: 120 mmHg — ABNORMAL HIGH (ref 83.0–108.0)

## 2020-08-07 MED ORDER — SENNOSIDES-DOCUSATE SODIUM 8.6-50 MG PO TABS
1.0000 | ORAL_TABLET | Freq: Every evening | ORAL | Status: DC | PRN
  Administered 2020-08-08: 1 via ORAL
  Filled 2020-08-07: qty 1

## 2020-08-07 MED ORDER — POLYETHYLENE GLYCOL 3350 17 G PO PACK: 17.0000 g | PACK | Freq: Every day | ORAL | Status: DC

## 2020-08-07 NOTE — Progress Notes (Signed)
[facility] PROGRESS NOTE   Carol Chavez ION:[id number] DOB: 12-22-1945 DOA: 08/06/2020  PCP: Judy Pimple, MD  Brief History/Interval Summary: 74 year old Caucasian female with a past medical history of GERD who was recently diagnosed with ALS a few weeks ago.  Placed on BiPAP by pulmonology at [facility].  She recently acquired the trilogy vent.  Had not used it yet.  EMS was called due to shortness of breath.  She was noted to be hypoxic she was brought to the emergency department.  Noted to be in acute respiratory failure with hypoxia as well as hypercapnia.  Pulmonology was consulted.  After discussions with family she was transitioned to DNR.  She was placed on her trilogy vent and was hospitalized.  Reason for Visit: Acute respiratory failure with hypoxia and hypercapnia  Consultants: Palliative care  Procedures: None  Antibiotics: Anti-infectives (From admission, onward)   None      Subjective/Interval History: Patient noted to be awake alert though confused.  Husband is at the bedside.  Patient denies any pain.  Wants to eat.     Assessment/Plan:  Acute respiratory failure with hypoxia and hypercapnia This is in the setting of hypoventilation due to amyotrophic lateral sclerosis which was recently diagnosed.  She seemed to have tolerated her trilogy went well.  Her ABG however is unchanged.  pH remains low at 7.3.  PCO2 is 91.  Bicarbonate 44.  PO2 is 1.1.  Discussions were held with the family by pulmonology yesterday.  She has a terminal illness which will only progress from here onwards.  They understand the situation.  Palliative care has been consulted as patient will likely need transition to hospice.  Family is interested in taking her home.  Continue with trilogy vent for now.  Start her on a diet.  Prognosis is poor.  If patient continues to decline then we will transition her to comfort care.  However the goal at this time is to get her back home to  be with her family.  Acute metabolic encephalopathy Most likely due to CO2 narcosis.  Recently diagnosed amyotrophic lateral sclerosis This is a terminal condition for her.   Right pulmonary nodule was incidentally seen on CT scan.   Not a candidate for further work-up due to above.   DVT Prophylaxis: None as hospice/comfort care is being pursued Code Status: DNR Family Communication: Discussed with the patient's husband Disposition Plan: Hopefully return home with hospice services   Status is: Inpatient  Remains inpatient appropriate because:Altered mental status and Inpatient level of care appropriate due to severity of illness   Dispo: The patient is from: Home              Anticipated d/c is to: Home              Anticipated d/c date is: 1 day              Patient currently is not medically stable to d/c.     Medications:  Scheduled: . sodium chloride flush  3 mL Intravenous Q12H   Continuous: . lactated ringers 50 mL/hr at 08/07/20 0130   KGM:[id number] **OR** acetaminophen, fentaNYL (SUBLIMAZE) injection, ondansetron **OR** ondansetron (ZOFRAN) IV   Objective:  Vital Signs  Vitals:   08/06/20 2215 08/06/20 2345 08/07/20 0030 08/07/20 0131  BP: 139/68 113/68 103/63 130/74  Pulse: 97 92 93 93  Resp: (!) 22 15 14 14   Temp:    (!) 97.3 F (36.3 C)  TempSrc:    Oral  SpO2: 100% 98% 99% 100%    Intake/Output Summary (Last 24 hours) at 08/07/2020 1249 Last data filed at 08/07/2020 0453 Gross per 24 hour  Intake 164.54 ml  Output 200 ml  Net -35.46 ml   There were no vitals filed for this visit.  General appearance: Is awake alert though distracted.  Confused. Resp: Diminished air entry bilateral bases.  No wheezing or rhonchi.  No definite crackles. Cardio: S1-S2 is normal regular.  No S3-S4.  No rubs murmurs or bruit GI: Abdomen is soft.  Nontender nondistended.  Bowel sounds are present normal.  No masses organomegaly Extremities: No edema.    Neurologic: Disoriented.  No obvious focal deficits.   Lab Results:  Data Reviewed: I have personally reviewed following labs and imaging studies  CBC: Recent Labs  Lab 08/06/20 1811 08/06/20 2125  WBC 9.8  --   NEUTROABS 7.0  --   HGB 15.3* 15.6*  HCT 48.0* 46.0  MCV 96.6  --   PLT 279  --     Basic Metabolic Panel: Recent Labs  Lab 08/06/20 1811 08/06/20 2125  NA 144 142  K 3.6 3.2*  CL 97*  --   CO2 37*  --   GLUCOSE 120*  --   BUN 15  --   CREATININE 0.50  --   CALCIUM 9.6  --     GFR: CrCl cannot be calculated (Unknown ideal weight.).    Recent Results (from the past 240 hour(s))  SARS Coronavirus 2 by RT PCR (hospital order, performed in [facility] hospital lab) Nasopharyngeal Nasopharyngeal Swab     Status: None   Collection Time: 08/06/20  6:19 PM   Specimen: Nasopharyngeal Swab  Result Value Ref Range Status   SARS Coronavirus 2 NEGATIVE NEGATIVE Final    Comment: (NOTE) SARS-CoV-2 target nucleic acids are NOT DETECTED.  The SARS-CoV-2 RNA is generally detectable in upper and lower respiratory specimens during the acute phase of infection. The lowest concentration of SARS-CoV-2 viral copies this assay can detect is 250 copies / mL. A negative result does not preclude SARS-CoV-2 infection and should not be used as the sole basis for treatment or other patient management decisions.  A negative result may occur with improper specimen collection / handling, submission of specimen other than nasopharyngeal swab, presence of viral mutation(s) within the areas targeted by this assay, and inadequate number of viral copies (<250 copies / mL). A negative result must be combined with clinical observations, patient history, and epidemiological information.  Fact Sheet for Patients:   BoilerBrush.com.cy  Fact Sheet for Healthcare Providers: https://pope.com/  This test is not yet approved or  cleared by  the [place] FDA and has been authorized for detection and/or diagnosis of SARS-CoV-2 by FDA under an Emergency Use Authorization (EUA).  This EUA will remain in effect (meaning this test can be used) for the duration of the COVID-19 declaration under Section 564(b)(1) of the Act, 21 U.S.C. section 360bbb-3(b)(1), unless the authorization is terminated or revoked sooner.  Performed at [facility], [address], [place], [place] [address]       Radiology Studies: CT Angio Chest PE W/Cm &/Or Wo Cm  Result Date: 08/06/2020 CLINICAL DATA:  Shortness of breath and chest pain EXAM: CT ANGIOGRAPHY CHEST WITH CONTRAST TECHNIQUE: Multidetector CT imaging of the chest was performed using the standard protocol during bolus administration of intravenous contrast. Multiplanar CT image reconstructions and MIPs were obtained to  evaluate the vascular anatomy. CONTRAST:  67mL OMNIPAQUE IOHEXOL 350 MG/ML SOLN COMPARISON:  08/06/2020 FINDINGS: Cardiovascular: This is a technically adequate evaluation of the pulmonary vasculature. No filling defects or pulmonary emboli. The heart is unremarkable without pericardial effusion. No evidence of thoracic aortic aneurysm or dissection. Minimal atherosclerosis of the descending thoracic aorta. Mediastinum/Nodes: Incidental 2.6 x 1.3 cm hypodense nodule right lobe thyroid. The trachea and esophagus are unremarkable. No pathologic adenopathy. Lungs/Pleura: Scattered areas of atelectasis are seen within the dependent lower lobes. No acute airspace disease, effusion, or pneumothorax. 4 mm subpleural right upper lobe pulmonary nodule reference image 47 of series 6. No other pulmonary nodules or masses. Mild upper lobe predominant emphysema. Central airways are patent. Upper Abdomen: No acute abnormality. Musculoskeletal: No acute or destructive bony lesions. Hemangioma again noted within the T10 vertebral body. Bilateral breast prostheses are identified, with heavily  calcified capsules. There is evidence of chronic intracapsular rupture on the right. Reconstructed images demonstrate no additional findings. Review of the MIP images confirms the above findings. IMPRESSION: 1. No evidence of pulmonary embolus. 2. Scattered bibasilar atelectasis without acute airspace disease. 3. 4 mm right upper lobe pulmonary nodule. No follow-up needed if patient is low-risk. Non-contrast chest CT can be considered in 12 months if patient is high-risk. This recommendation follows the consensus statement: Guidelines for Management of Incidental Pulmonary Nodules Detected on CT Images: From the Fleischner Society 2017; Radiology 2017; 284:228-243. 4. 2.6 cm right lobe thyroid nodule. Recommend thyroid US if not previously evaluated. (Ref: J Am Coll Radiol. 2015 Feb;12(2): 143-50). 5. Aortic Atherosclerosis (ICD10-I70.0) and Emphysema (ICD10-J43.9). Electronically Signed   By: Sharlet Salina M.D.   On: 08/06/2020 19:57   DG Chest Portable 1 View  Result Date: 08/06/2020 CLINICAL DATA:  Shortness of breath and chest pain. EXAM: PORTABLE CHEST 1 VIEW COMPARISON:  None. FINDINGS: There is no evidence of acute infiltrate, pleural effusion or pneumothorax. The heart size and mediastinal contours are within normal limits. Partially calcified bilateral breast implants are seen. The visualized skeletal structures are unremarkable. IMPRESSION: No active disease. Electronically Signed   By: Aram Candela M.D.   On: 08/06/2020 17:56       LOS: 1 day   Letitia Sabala  [facility] Pager on [email or web address]  08/07/2020, 12:49 PM

## 2020-08-07 NOTE — ED Notes (Signed)
Called 6N pt room is been changed from 19 to 5, awaiting for return call from nurse

## 2020-08-07 NOTE — Progress Notes (Signed)
Pt's husband is asking if pt has a blood gas level ordered for this Am, because he would like to know if her C02 level improves. Msg sent to Dr Rito Ehrlich. Also concerned if pt can drink (NPO rt now). Said "I will give her a drink, and I will be responsible for it".

## 2020-08-07 NOTE — Progress Notes (Signed)
Patient placed on home trilogy at this time. 4L bled into machine.

## 2020-08-07 NOTE — Progress Notes (Addendum)
CRITICAL VALUE ALERT  Critical Value: abnormal ABG  Date & Time Notied: 08/07/20 @10 :08  Provider Notified:Dr. 09  Orders Received/Actions taken: MD will review results

## 2020-08-07 NOTE — Progress Notes (Signed)
NIF -5 after 3 attempts. RT will continue to monitor as needed.

## 2020-08-07 NOTE — Progress Notes (Signed)
Patient relative in room informed me that no assistance is needed for the home triology tonight

## 2020-08-07 NOTE — Progress Notes (Signed)
RT reported ABG to 6N charge RN and MD. RT will continue to monitor as needed.

## 2020-08-07 NOTE — Telephone Encounter (Signed)
She is in the hospital

## 2020-08-07 NOTE — Progress Notes (Signed)
Very poor patient effort the best out of 3 attempts is resulted below.  NIF: -12

## 2020-08-07 NOTE — Care Management (Signed)
Family meeting with palliative care , will follow up after meeting.   Ronny Flurry RN

## 2020-08-07 NOTE — Consult Note (Signed)
Consultation Note Date: 08/07/2020   Patient Name: Carol Chavez Chavez  DOB: 1946-01-11  MRN: [id number]  Age / Sex: 74 y.o., female   PCP: Tower, Carol Chavez Fanny, MD Referring Physician: Bonnielee Haff, MD   REASON FOR CONSULTATION:Establishing goals of care  Palliative Care consult requested for goals of care discussion in this 74 y.o. female with multiple medical problems including left foot drop, GERD, and recent ALS diagnosis (per [facility]).  She presented to the ED from home with complaints of shortness of breath.  Patient was recently started on home BiPAP by her [facility] pulmonologist.  During work-up CTA negative for PE however consistent with hypoventilation.  Since admission patient has continued on home trilogy machine.  She is receiving treatment for hypoxemic and hypercarbic respiratory failure.  PCO2 91.  Bicarbonate 44.  PO2 120.   Clinical Assessment and Goals of Care: I have reviewed medical records including lab results, imaging, Epic notes, and MAR, received report from the bedside RN, and assessed the patient. I met at the bedside with patient's family: Carol Chavez Chavez(husband), Carol Chavez Chavez (daughters), and Carol Chavez Chavez (son) to discuss diagnosis prognosis, GOC, EOL wishes, disposition and options.  Carol Chavez Chavez is resting in bed. She is alert and oriented x3 although with some recent confusion during the night and morning. She is tolerating oxygen @ 4L but with noticeable shortness of breath when speaking for longer period and with exertion.   I introduced Palliative Medicine as specialized medical care for people living with serious illness. It focuses on providing relief from the symptoms and stress of a serious illness. The goal is to improve quality of life for both the patient and the family.  Carol Chavez verbalizes understanding and appreciation.  We discussed a brief life review of the patient, along with her functional and nutritional status.  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  They have  4 children who are all in the medical field (son is a Animal nutritionist, daughters are ICU nurses, and a oncology nurse navigator).  Patient and husband has been married for over 36 years.  She enjoys spending time with her family and friends.  Family shares patient's significant decline in health over the past month.  Patient unfortunately received her ALS diagnosis approximately 3 weeks ago today.  She began initially showing significant signs after her second Covid vaccination.  Family reports foot drop presentation in March 2021.  She utilizes a cane or Rollator for ambulation assistance however due to increased weakness she has not walked much in the past several days.  Family reports increasing fatigue and naps throughout the day.  Also reporting difficulty with swallowing, chewing, and prolongation in between thoughts and while talking.  We discussed Her current illness and what it means in the larger context of Her on-going co-morbidities. With specific discussions regarding patient's ALS, respiratory failure, and overall functional and nutritional decline.  Natural disease trajectory and expectations at EOL were discussed.  Family verbalized understanding and appreciation of updates.  When attempting to discuss further patient's ALS diagnosis, signs and symptoms and expectations patient expresses limited interest in further discussion.  She verbalized wishes to no longer speak on topic.  Family reports patient is originally scheduled to have evaluation and PEG tube placed in upcoming weeks per [facility] provider.  Recommendation against placement in the setting of advanced ALS provided to children.  Family verbalizes understanding of significant decline expressing "patient is declining and things are happening like a Warehouse manager" support provided.  I attempted to elicit values and goals of care important to the patient.    The difference between aggressive medical intervention and comfort  care was considered in light of the patient's goals of care.  Family would like to continue with ongoing discussions amongst themselves and patient specialist team prior to making any significant decisions.  I highly encourage family to continue with ongoing goals of care discussions in the setting of advanced ALS.  Family verbalized understanding and appreciation.  Patient/family confirms DNR/DNI.  Would like to continue to treat the treatable while hospitalized.  They are remaining hopeful for some stabilization with a goal of patient discharging home within the next 24-48 hours under the support of family.  They would like to follow-up with their [facility] Neurologist and receive further recommendations and support from his team, possibly collaborating with a physician more locally.  Hospice and Palliative Care services outpatient were explained and offered. Patient and family verbalized their understanding and awareness of both palliative and hospice's goals and philosophy of care.  Family requesting outpatient palliative support with awareness they may transition care to hospice at any time by discussing further with outpatient medical team.  Questions and concerns were addressed. The family was encouraged to call with questions or concerns.  PMT will continue to support holistically.  1845: Received a call from patient's daughter Carol Chavez Chavez expressing family has continued with discussions.  They would like to follow-up tomorrow for further discussions.  Dr. Vivianne Master with [facility] neurology has been in contact and is requesting a scheduled ED visit with family tomorrow morning at 31 to further discuss patient's newly diagnosed ALS and his rapid progression.  I will follow-up with family tomorrow morning.  SOCIAL HISTORY:     reports that she has never smoked. She has never used smokeless tobacco. She reports current alcohol use. She reports that she does not use drugs.  CODE STATUS: DNR  ADVANCE DIRECTIVES:  Primary Decision Maker: Family   SYMPTOM MANAGEMENT: Per attending  Palliative Prophylaxis:   Aspiration, Bowel Regimen, Delirium Protocol, Frequent Pain Assessment and Oral Care  PSYCHO-SOCIAL/SPIRITUAL:  Support System: Family  Desire for further Chaplaincy support: No  Additional Recommendations (Limitations, Scope, Preferences):  Full Scope Treatment and DNR  Education on hospice/palliative    PAST MEDICAL HISTORY: Past Medical History:  Diagnosis Date  . Balance problem   . Foot drop, left   . GERD (gastroesophageal reflux disease)   . Memory changes    pt denies  . Vitamin D deficiency    pt denies    ALLERGIES:  has No Known Allergies.   MEDICATIONS:  Current Facility-Administered Medications  Medication Dose Route Frequency Provider Last Rate Last Admin  . acetaminophen (TYLENOL) tablet 650 mg  650 mg Oral Q6H PRN Icard, Bradley L, DO       Or  . acetaminophen (TYLENOL) suppository 650 mg  650 mg Rectal Q6H PRN Icard, Bradley L, DO      . fentaNYL (SUBLIMAZE) injection 12.5 mcg  12.5 mcg Intravenous Q2H PRN Icard, Bradley L, DO      . lactated ringers infusion   Intravenous Continuous Icard, Bradley L, DO 50 mL/hr at 08/07/20 0130 New Bag at 08/07/20 0130  . ondansetron (ZOFRAN) tablet 4 mg  4 mg Oral Q6H PRN Icard, Bradley L, DO       Or  . ondansetron (ZOFRAN) injection 4 mg  4 mg Intravenous Q6H PRN Icard, Bradley L, DO      . sodium chloride flush (  NS) 0.9 % injection 3 mL  3 mL Intravenous Q12H Icard, Bradley L, DO        VITAL SIGNS: BP 130/74 (BP Location: Right Arm)   Pulse 93   Temp (!) 97.3 F (36.3 C) (Oral)   Resp 14   SpO2 100%  There were no vitals filed for this visit.  Estimated body mass index is 20.46 kg/m as calculated from the following:   Height as of 05/20/20: 5' 3.25" (1.607 m).   Weight as of 05/20/20: 52.8 kg.  LABS: CBC:    Component Value Date/Time   WBC 9.8 08/06/2020 1811   HGB 15.6 (H) 08/06/2020 2125   HGB 14.7  05/20/2020 1212   HCT 46.0 08/06/2020 2125   HCT 43.9 05/20/2020 1212   PLT 279 08/06/2020 1811   PLT 302 05/20/2020 1212   Comprehensive Metabolic Panel:    Component Value Date/Time   NA 142 08/06/2020 2125   NA 137 05/20/2020 1212   K 3.2 (L) 08/06/2020 2125   BUN 15 08/06/2020 1811   BUN 12 05/20/2020 1212   CREATININE 0.50 08/06/2020 1811   ALBUMIN 4.5 05/20/2020 1212     Review of Systems  Constitutional: Positive for activity change and appetite change.  Respiratory: Positive for shortness of breath.   Musculoskeletal: Positive for gait problem.  Neurological: Positive for weakness.  Unless otherwise noted, a complete review of systems is negative.  Physical Exam General: NAD, frail ill appearing Cardiovascular: regular rate and rhythm Pulmonary:  diminished bilaterally, shortness of breath (trilogy)  Abdomen: soft, nontender, + bowel sounds Extremities: no edema, no joint deformities Skin: no rashes, warm and dry Neurological: alert and oriented x3, mood appropriate   Prognosis: Poor in the setting of rapidly advancing ALS, respiratory failure, deconditioning, minimal oral intake.  Discharge Planning:  To Be Determined  Recommendations: . DNR/DNI-as confirmed by patient/family . Continue with current plan of care per medical team  . Family remaining hopeful for some improvement/stability.  They are relying on input from patient's [facility] ALS provider with further recommendations of treatment/care. . Family would like patient to discharge home once medically appropriate (hopeful next 24-48 hours).  Extensive discussion regarding patient's ALS, signs and symptoms of progression.  Patient somewhat anxious and requesting not to continue with discussion at this time.  Encouraged ongoing discussions amongst family regarding goals of care and further awareness of patient's disease trajectory. . Family requesting outpatient palliative support at this time [facility] referral placed  for assistance with arranging) . PMT will continue to support and follow. Please call team line with urgent needs.   Palliative Performance Scale: PPS 20-30%              Patient and family expressed understanding and was in agreement with this plan.   Thank you for allowing the Palliative Medicine Team to assist in the care of this patient.  Time In: 1330 Time Out: 1450 Time Total: 80 min.   Visit consisted of counseling and education dealing with the complex and emotionally intense issues of symptom management and palliative care in the setting of serious and potentially life-threatening illness.Greater than 50%  of this time was spent counseling and coordinating care related to the above assessment and plan.  Signed by:  Alda Lea, AGPCNP-BC Palliative Medicine Team  Phone: [phone] Pager: [phone] Amion: Bjorn Pippin   .cov

## 2020-08-08 DIAGNOSIS — Z66 Do not resuscitate: Secondary | ICD-10-CM

## 2020-08-08 DIAGNOSIS — G1221 Amyotrophic lateral sclerosis: Secondary | ICD-10-CM | POA: Diagnosis not present

## 2020-08-08 DIAGNOSIS — E785 Hyperlipidemia, unspecified: Secondary | ICD-10-CM

## 2020-08-08 DIAGNOSIS — Z515 Encounter for palliative care: Secondary | ICD-10-CM

## 2020-08-08 DIAGNOSIS — G629 Polyneuropathy, unspecified: Secondary | ICD-10-CM

## 2020-08-08 DIAGNOSIS — Z7189 Other specified counseling: Secondary | ICD-10-CM

## 2020-08-08 DIAGNOSIS — J9601 Acute respiratory failure with hypoxia: Secondary | ICD-10-CM

## 2020-08-08 MED ORDER — FLEET ENEMA 7-19 GM/118ML RE ENEM
1.0000 | ENEMA | Freq: Once | RECTAL | Status: AC
  Administered 2020-08-08: 1 via RECTAL
  Filled 2020-08-08: qty 1

## 2020-08-08 MED ORDER — LORAZEPAM 0.5 MG PO TABS: 0.5000 mg | ORAL_TABLET | Freq: Four times a day (QID) | ORAL | Status: DC | PRN

## 2020-08-08 MED ORDER — BISACODYL 10 MG RE SUPP
10.0000 mg | Freq: Once | RECTAL | Status: AC
  Administered 2020-08-08: 10 mg via RECTAL
  Filled 2020-08-08: qty 1

## 2020-08-08 MED ORDER — LORAZEPAM 0.5 MG PO TABS: 0.5000 mg | ORAL_TABLET | Freq: Four times a day (QID) | ORAL | 0 refills | Status: AC | PRN

## 2020-08-08 MED ORDER — MORPHINE SULFATE (CONCENTRATE) 10 MG/0.5ML PO SOLN: 10.0000 mg | ORAL | 0 refills | Status: AC | PRN

## 2020-08-08 MED ORDER — MORPHINE SULFATE (CONCENTRATE) 10 MG/0.5ML PO SOLN: 10.0000 mg | ORAL | Status: DC | PRN

## 2020-08-08 NOTE — Discharge Summary (Addendum)
Physician Discharge Summary  Carol Chavez WUJ:[id number] DOB: [birth year] DOA: 08/06/2020  PCP: Carol Chavez, Carol A, MD  Admit date: 08/06/2020 Discharge date: 08/09/2020  Admitted From: Home  Disposition:  Home with hospice   Recommendations for Outpatient Follow-up and new medication changes:  1. Follow up with Dr. Milinda Antisower in 7 days.  2. Follow up with hospice team.   Home Health: hospice   Equipment/Devices: na    Discharge Condition: stable  CODE STATUS: dnr   Diet recommendation: dysphagia 2 diet with aspiration precautions.    Brief/Interim Summary: Patient was admitted to the hospital with Chavez working diagnosis of acute hypoxic and hypercapnic respiratory failure, related to amyotrophic lateral sclerosis.  74 year old female with past medical history for GERD, who was recently diagnosed with amyotrophic lateral sclerosis, and placed on noninvasive mechanical ventilation by [facility].  Patient was found hypoxic at home and was brought into the hospital for further evaluation.  She had significant decline on her overall health for the last several weeks to months.  In the emergency department patient was placed on BiPAP due to respiratory distress, on noninvasive mechanical ventilation her oxygen saturation was 100%, blood pressure 132/80, pulse rate 99, respiratory rate 17, temperature 98.8.  She had shallow breathing, positive use of accessory muscles, positive rales at the bases, heart S1-S2, present rhythmic, her abdomen was soft, no lower extremity edema. Arterial blood gas pH 7.30, PCO2 87.2, PO2 127, bicarb 4 43.5, oxygen saturation 98%. Sodium 144, potassium 3.6, chloride 97, bicarb 37, glucose 120, BUN 15, creatinine 0.5, white count 9.8, hemoglobin 15.3, hematocrit 48.0, platelets 279.  SARS COVID-19 was negative. CT chest negative for pulmonary embolism, positive bibasilar atelectasis.  Chest radiograph negative for infiltrates. EKG 90 bpm, left axis deviation,  left anterior fascicular block, right bundle branch block, sinus rhythm, no significant ST segment or T wave changes.   1.  Acute on chronic hypoxic and hypercapnic respiratory failure due to amyotrophic lateral sclerosis.  Patient with poor prognosis, worsening and progressive respiratory failure.  She has decided to continue care under hospice services.  Avoid invasive mechanical ventilation.  Patient will be discharged with as needed morphine and lorazepam, used trilogy device as tolerated.  2.  Acute metabolic encephalopathy.  Likely related to CO2 narcosis, her mentation has improved but, discharge.  Patient will continue using her trilogy device as tolerated.  3. Amyotrophic lateral sclerosis.  Patient will follow up with hospice service, she has been [facility] pulmonary [facility].  4.  Incidental right pulmonary nodule.  No indication for follow-up in the setting of poor prognosis.  Discharge Diagnoses:  Principal Problem:   Acute on chronic respiratory failure with hypoxia and hypercapnia (HCC) Active Problems:   Hyperlipidemia   Neuropathy   ALS (amyotrophic lateral sclerosis) (HCC)    Discharge Instructions   Allergies as of 08/08/2020   No Known Allergies     Medication List    STOP taking these medications   busPIRone 15 MG tablet Commonly known as: BUSPAR   diazepam 5 MG tablet Commonly known as: VALIUM   estradiol 0.1 MG/GM vaginal cream Commonly known as: ESTRACE VAGINAL   gabapentin 100 MG capsule Commonly known as: NEURONTIN   omeprazole 20 MG capsule Commonly known as: PRILOSEC   ondansetron 4 MG disintegrating tablet Commonly known as: Zofran ODT   terbinafine 250 MG tablet Commonly known as: LamISIL     TAKE these medications   ibuprofen 200 MG tablet Commonly known  as: ADVIL Take 600 mg by mouth 2 (two) times daily.   LORazepam 0.5 MG tablet Commonly known as: ATIVAN Take 1 tablet (0.5 mg total) by mouth every 6 (six) hours as  needed for anxiety.   MAGNESIUM PO Take 2 tablets by mouth 2 (two) times daily.   morphine CONCENTRATE 10 MG/0.5ML Soln concentrated solution Take 0.5 mLs (10 mg total) by mouth every 2 (two) hours as needed for moderate pain, severe pain or shortness of breath.   riluzole 50 MG tablet Commonly known as: RILUTEK Take 50 mg by mouth 2 (two) times daily.   VITAMIN D PO Take 3 capsules by mouth daily.   VITAMIN E PO Take 1 tablet by mouth daily.            Durable Medical Equipment  (From admission, onward)         Start     Ordered   08/07/20 1732  For home use only DME oxygen  Once       Question Answer Comment  Length of Need 6 Months   Mode or (Route) Nasal cannula   Liters per Minute 2   Frequency Continuous (stationary and portable oxygen unit needed)   Oxygen conserving device Yes   Oxygen delivery system Gas      08/07/20 1731          No Known Allergies  Consultations:  Pulmonary   Palliative Care   Procedures/Studies: CT Angio Chest PE W/Cm &/Or Wo Cm  Result Date: 08/06/2020 CLINICAL DATA:  Shortness of breath and chest pain EXAM: CT ANGIOGRAPHY CHEST WITH CONTRAST TECHNIQUE: Multidetector CT imaging of the chest was performed using the standard protocol during bolus administration of intravenous contrast. Multiplanar CT image reconstructions and MIPs were obtained to evaluate the vascular anatomy. CONTRAST:  50mL OMNIPAQUE IOHEXOL 350 MG/ML SOLN COMPARISON:  08/06/2020 FINDINGS: Cardiovascular: This is Chavez technically adequate evaluation of the pulmonary vasculature. No filling defects or pulmonary emboli. The heart is unremarkable without pericardial effusion. No evidence of thoracic aortic aneurysm or dissection. Minimal atherosclerosis of the descending thoracic aorta. Mediastinum/Nodes: Incidental 2.6 x 1.3 cm hypodense nodule right lobe thyroid. The trachea and esophagus are unremarkable. No pathologic adenopathy. Lungs/Pleura: Scattered areas of  atelectasis are seen within the dependent lower lobes. No acute airspace disease, effusion, or pneumothorax. 4 mm subpleural right upper lobe pulmonary nodule reference image 47 of series 6. No other pulmonary nodules or masses. Mild upper lobe predominant emphysema. Central airways are patent. Upper Abdomen: No acute abnormality. Musculoskeletal: No acute or destructive bony lesions. Hemangioma again noted within the T10 vertebral body. Bilateral breast prostheses are identified, with heavily calcified capsules. There is evidence of chronic intracapsular rupture on the right. Reconstructed images demonstrate no additional findings. Review of the MIP images confirms the above findings. IMPRESSION: 1. No evidence of pulmonary embolus. 2. Scattered bibasilar atelectasis without acute airspace disease. 3. 4 mm right upper lobe pulmonary nodule. No follow-up needed if patient is low-risk. Non-contrast chest CT can be considered in 12 months if patient is high-risk. This recommendation follows the consensus statement: Guidelines for Management of Incidental Pulmonary Nodules Detected on CT Images: From the Fleischner Society 2017; Radiology 2017; 284:228-243. 4. 2.6 cm right lobe thyroid nodule. Recommend thyroid US if not previously evaluated. (Ref: J Am Coll Radiol. 2015 Feb;12(2): 143-50). 5. Aortic Atherosclerosis (ICD10-I70.0) and Emphysema (ICD10-J43.9). Electronically Signed   By: Sharlet Salina M.D.   On: 08/06/2020 19:57   DG Chest Portable 1 View  Result Date: 08/06/2020 CLINICAL DATA:  Shortness of breath and chest pain. EXAM: PORTABLE CHEST 1 VIEW COMPARISON:  None. FINDINGS: There is no evidence of acute infiltrate, pleural effusion or pneumothorax. The heart size and mediastinal contours are within normal limits. Partially calcified bilateral breast implants are seen. The visualized skeletal structures are unremarkable. IMPRESSION: No active disease. Electronically Signed   By: Aram Candela M.D.    On: 08/06/2020 17:56      Subjective: Patient is stable, dyspnea is controlled, using intermittently her Trilogy device.   Discharge Exam: Vitals:   08/08/20 0640 08/08/20 1406  BP: 120/71 126/68  Pulse: 69 89  Resp: 14 16  Temp: (!) 97.4 F (36.3 C) (!) 97.5 F (36.4 C)  SpO2: 92% 100%   Vitals:   08/07/20 0030 08/07/20 0131 08/08/20 0640 08/08/20 1406  BP: 103/63 130/74 120/71 126/68  Pulse: 93 93 69 89  Resp: 14 14 14 16   Temp:  (!) 97.3 F (36.3 C) (!) 97.4 F (36.3 C) (!) 97.5 F (36.4 C)  TempSrc:  Oral Oral Oral  SpO2: 99% 100% 92% 100%    General: Not in pain, positive dyspnea  Neurology: Awake and alert, non focal  E ENT: no pallor, no icterus, oral mucosa moist Cardiovascular: No JVD. S1-S2 present, rhythmic, no gallops, rubs, or murmurs. No lower extremity edema. Pulmonary: positive breath sounds bilaterally Gastrointestinal. Abdomen soft and non tender Skin. No rashes Musculoskeletal: no joint deformities   The results of significant diagnostics from this hospitalization (including imaging, microbiology, ancillary and laboratory) are listed below for reference.     Microbiology: Recent Results (from the past 240 hour(s))  SARS Coronavirus 2 by RT PCR (hospital order, performed in [facility] hospital lab) Nasopharyngeal Nasopharyngeal Swab     Status: None   Collection Time: 08/06/20  6:19 PM   Specimen: Nasopharyngeal Swab  Result Value Ref Range Status   SARS Coronavirus 2 NEGATIVE NEGATIVE Final    Comment: (NOTE) SARS-CoV-2 target nucleic acids are NOT DETECTED.  The SARS-CoV-2 RNA is generally detectable in upper and lower respiratory specimens during the acute phase of infection. The lowest concentration of SARS-CoV-2 viral copies this assay can detect is 250 copies / mL. Chavez negative result does not preclude SARS-CoV-2 infection and should not be used as the sole basis for treatment or other patient management decisions.  Chavez negative result  may occur with improper specimen collection / handling, submission of specimen other than nasopharyngeal swab, presence of viral mutation(s) within the areas targeted by this assay, and inadequate number of viral copies (<250 copies / mL). Chavez negative result must be combined with clinical observations, patient history, and epidemiological information.  Fact Sheet for Patients:   08/08/20  Fact Sheet for Healthcare Providers: BoilerBrush.com.cy  This test is not yet approved or  cleared by the https://pope.com/ FDA and has been authorized for detection and/or diagnosis of SARS-CoV-2 by FDA under an Emergency Use Authorization (EUA).  This EUA will remain in effect (meaning this test can be used) for the duration of the COVID-19 declaration under Section 564(b)(1) of the Act, 21 U.S.C. section 360bbb-3(b)(1), unless the authorization is terminated or revoked sooner.  Performed at [facility], [address] [place], [place]      Labs: BNP (last 3 results) No results for input(s): BNP in the last 8760 hours. Basic Metabolic Panel: Recent Labs  Lab 08/06/20 1811 08/06/20 2125  NA 144 142  K 3.6 3.2*  CL 97*  --   CO2 37*  --   GLUCOSE 120*  --   BUN 15  --   CREATININE 0.50  --   CALCIUM 9.6  --    Liver Function Tests: No results for input(s): AST, ALT, ALKPHOS, BILITOT, PROT, ALBUMIN in the last 168 hours. No results for input(s): LIPASE, AMYLASE in the last 168 hours. No results for input(s): AMMONIA in the last 168 hours. CBC: Recent Labs  Lab 08/06/20 1811 08/06/20 2125  WBC 9.8  --   NEUTROABS 7.0  --   HGB 15.3* 15.6*  HCT 48.0* 46.0  MCV 96.6  --   PLT 279  --    Cardiac Enzymes: No results for input(s): CKTOTAL, CKMB, CKMBINDEX, TROPONINI in the last 168 hours. BNP: Invalid input(s): POCBNP CBG: No results for input(s): GLUCAP in the last 168 hours. D-Dimer No results for  input(s): DDIMER in the last 72 hours. Hgb A1c No results for input(s): HGBA1C in the last 72 hours. Lipid Profile No results for input(s): CHOL, HDL, LDLCALC, TRIG, CHOLHDL, LDLDIRECT in the last 72 hours. Thyroid function studies No results for input(s): TSH, T4TOTAL, T3FREE, THYROIDAB in the last 72 hours.  Invalid input(s): FREET3 Anemia work up No results for input(s): VITAMINB12, FOLATE, FERRITIN, TIBC, IRON, RETICCTPCT in the last 72 hours. Urinalysis    Component Value Date/Time   COLORURINE YELLOW 02/08/2020 1713   APPEARANCEUR CLEAR 02/08/2020 1713   LABSPEC 1.012 02/08/2020 1713   PHURINE 6.0 02/08/2020 1713   GLUCOSEU NEGATIVE 02/08/2020 1713   HGBUR MODERATE (Chavez) 02/08/2020 1713   BILIRUBINUR NEGATIVE 02/08/2020 1713   KETONESUR 20 (Chavez) 02/08/2020 1713   PROTEINUR NEGATIVE 02/08/2020 1713   NITRITE NEGATIVE 02/08/2020 1713   LEUKOCYTESUR NEGATIVE 02/08/2020 1713   Sepsis Labs Invalid input(s): PROCALCITONIN,  WBC,  LACTICIDVEN Microbiology Recent Results (from the past 240 hour(s))  SARS Coronavirus 2 by RT PCR (hospital order, performed in [facility] hospital lab) Nasopharyngeal Nasopharyngeal Swab     Status: None   Collection Time: 08/06/20  6:19 PM   Specimen: Nasopharyngeal Swab  Result Value Ref Range Status   SARS Coronavirus 2 NEGATIVE NEGATIVE Final    Comment: (NOTE) SARS-CoV-2 target nucleic acids are NOT DETECTED.  The SARS-CoV-2 RNA is generally detectable in upper and lower respiratory specimens during the acute phase of infection. The lowest concentration of SARS-CoV-2 viral copies this assay can detect is 250 copies / mL. Chavez negative result does not preclude SARS-CoV-2 infection and should not be used as the sole basis for treatment or other patient management decisions.  Chavez negative result may occur with improper specimen collection / handling, submission of specimen other than nasopharyngeal swab, presence of viral mutation(s) within the areas  targeted by this assay, and inadequate number of viral copies (<250 copies / mL). Chavez negative result must be combined with clinical observations, patient history, and epidemiological information.  Fact Sheet for Patients:   BoilerBrush.com.cy  Fact Sheet for Healthcare Providers: https://pope.com/  This test is not yet approved or  cleared by the [place] FDA and has been authorized for detection and/or diagnosis of SARS-CoV-2 by FDA under an Emergency Use Authorization (EUA).  This EUA will remain in effect (meaning this test can be used) for the duration of the COVID-19 declaration under Section 564(b)(1) of the Act, 21 U.S.C. section 360bbb-3(b)(1), unless the authorization is terminated or revoked sooner.  Performed at [facility], [address], [place], [place] [address]  Time coordinating discharge: 45 minutes  SIGNED:   Coralie Keens, MD  [facility] 08/08/2020, 2:50 PM

## 2020-08-08 NOTE — Progress Notes (Signed)
Daily Progress Note   Patient Name: Carol Chavez       Date: 08/08/2020 DOB: 10-22-1946  Age: 74 y.o. MRN#: [id number] Attending Physician: Coralie Keens Primary Care Physician: Tower, Audrie Gallus, MD Admit Date: 08/06/2020  Reason for Consultation/Follow-up: Establishing goals of care  Subjective: Patient resting in bed comfortably.  Denies pain.  Continues to have some shortness of breath with exertion and generalized weakness.  Appetite remains poor.  Spoke with patient's daughter Tamela Oddi via phone.  E-visit scheduled for this morning with patient's neurologist at [facility] for further input/recommendations regarding patient's disposition and care.  1345: Follow-up with family post visit with Dr. Lia Foyer [facility] Neurologist).  Family has confirmed wishes to discharge patient home with outpatient hospice support.  Dr. Lia Foyer was clear in his recommendations to family regarding patient's rapid progression of ALS with expectations of further decline and poor prognosis.  Family has verbalized understanding of both our medical team and [facility]'s medical team recommendations for hospice and are in full agreement.  Further education provided to family regarding outpatient hospice support, goals and philosophy of care.  Family is requesting home equipment (wheelchair, oxygen, hospital bed).  Education provided on referral process and further communication from medical team including our social Investment banker, operational.  Tamela Oddi is the names family contact at this point and all communication has been requested to go through her.  Extensive discussion with family regarding use of home trilogy machine.  They are aware this is considered more of an aggressive measure of care and possibly not funded by hospice services.  Family verbalized understanding and awareness they will be responsible for all inquire cost if not covered.  They also verbalized understanding that use of trilogy is only for comfort/symptom  management and plan to use temporarily as needed.  They are not under any intentions of using machine to prolong patient's life.  Patient becomes very anxious when discussing hospice.  Family is requesting not to further mention "hospice" in front of patient to eliminate increased work of breathing and anxiety.  They are hopeful patient will discharge home and will be more comfortable amongst family and friends at that time they will be able to provide additional comfort that she needs.    All questions answered and support provided.  Length of Stay: 2 days  Vital Signs: BP 126/68 (BP Location: Right Arm)   Pulse 89   Temp (!) 97.5 F (36.4 C) (Oral)   Resp 16   SpO2 100%  SpO2: SpO2: 100 % O2 Device: O2 Device: CPAP O2 Flow Rate: O2 Flow Rate (L/min): 4 L/min         Palliative Care Assessment & Plan  HPI: Palliative Care consult requested for goals of care discussion in this 74 y.o. female with multiple medical problems including left foot drop, GERD, and recent ALS diagnosis (per [facility]).  She presented to the ED from home with complaints of shortness of breath.  Patient was recently started on home BiPAP by her [facility] pulmonologist.  During work-up CTA negative for PE however consistent with hypoventilation.  Since admission patient has continued on home trilogy machine.  She is receiving treatment for hypoxemic and hypercarbic respiratory failure.  PCO2 91.  Bicarbonate 44.  PO2 120.   Code Status:  DNR  Goals of Care/Recommendations:  DNR/DNI  Continue to treat the treatable while hospitalized.  No escalation/aggressive levels of care.  Patient to discharge home with outpatient hospice support ([facility]).  Family planning to utilize trilogy machine  as needed for comfort in addition to home oxygen use.  Also requesting home DME of oxygen/hospital bed/wheelchair.  Family reports they would like to transport patient home by private vehicle.  Medical team working with family and  hospice to arrange for equipment delivery and home care needs.  Roxanol as needed for pain/respiratory support  Ativan as needed for anxiety/agitation  PMT will continue to follow as needed.  Please call team line for urgent needs.  Prognosis:Weeks_Months  Discharge Planning: Home with Hospice  Thank you for allowing the Palliative Medicine Team to assist in the care of this patient.  Time Total: 65 min.   Visit consisted of counseling and education dealing with the complex and emotionally intense issues of symptom management and palliative care in the setting of serious and potentially life-threatening illness.Greater than 50%  of this time was spent counseling and coordinating care related to the above assessment and plan.  Willette Alma, AGPCNP-BC  Palliative Medicine Team [phone]

## 2020-08-08 NOTE — Progress Notes (Signed)
Patent examiner [facility]) Hospital Liaison: RN note     Notified by Transition of Care Manger Octavio Graves, CSW of patient/family request for [facility] services at home after discharge. Chart and patient information under review by [facility] physician. Hospice eligibility pending currently.     Writer spoke with family  to initiate education related to hospice philosophy, services and team approach to care. Family  verbalized understanding of information given. Per discussion, plan is for discharge to home by   [facility]AR.   Please send signed and completed DNR form home with patient/family. Patient will need prescriptions for discharge comfort medications.      DME needs have been discussed, patient currently has the following equipment in the home: none.  Patient/family requests the following DME for delivery to the home:  Oxygen, W/C, hospital bed. [facility] equipment manager has been notified and will contact DME provider to arrange delivery to the home. Home address has been verified and is correct in the chart. Tamela Oddi   is the family member to contact to arrange time of delivery.      [facility] aware of the above. Please notify [facility] when patient is ready to leave the unit at discharge. (Call [phone] or [phone] after 5pm.) [facility] information and contact numbers given to  [facility].       Please call with any hospice related questions.      Thank you for this referral.      Elsie Saas, RN, [facility] (listed on AMION under Hospice [facility])   [phone]

## 2020-08-08 NOTE — Social Work (Signed)
Plan for home hospice, choice provided by palliative team, [facility] has been given the referral. Pt will need home oxygen, hospital bed and wheelchair to d/c home. Pt home trilogy has been paid for by family for the next few weeks until pt comfortable at home. Per palliative this has been discussed with [facility].   [place], RN liaison to f/u with [place] Qua [phone]).  Octavio Graves, MSW, LCSW [facility] Clinical Social Work

## 2020-08-08 NOTE — Progress Notes (Signed)
Patient got -10 on NIF with 3 attempts. Patient had very poor effort with it.

## 2020-08-09 DIAGNOSIS — Z66 Do not resuscitate: Secondary | ICD-10-CM

## 2020-08-09 DIAGNOSIS — Z515 Encounter for palliative care: Secondary | ICD-10-CM

## 2020-08-09 DIAGNOSIS — Z7189 Other specified counseling: Secondary | ICD-10-CM

## 2020-08-09 MED ORDER — RESOURCE THICKENUP CLEAR PO POWD
ORAL | Status: DC | PRN
  Filled 2020-08-09: qty 125

## 2020-08-09 NOTE — Progress Notes (Signed)
Patient got -10 on NIF with 3 attempts. Patient had very poor effort with it.  

## 2020-08-09 NOTE — Progress Notes (Signed)
   Palliative Medicine Inpatient Follow Up Note   Today's Discussion (08/09/2020): Chart reviewed. I was asked by Michiel Cowboy MSW and Bevely Palmer of [facility] to further clarify patients Trilogy as the family received a call that it would be picked up Wednesday afternoon.  I met with the patient, her husband, and her other daughter at bedside. They were not quite clear on the situation at hand.   Verified that the trilogy could be paid for privately.   I called Butch Penny (med emporium rep) to gain greater clarification. She shares that they are the vendors for the Trilogy and they are not contracted with [facility] or [employer]. I told her that [facility] would not cover the cost of the trilogy and this is a private expense that the family will be supplementing to aid in the patients ongoing comfort. Butch Penny expressed understanding and shared that she will call the patients daughter, Gwinda Passe to get the private billing information and to clarify that they will not be removing the equipment on Wednesday.   This information was shared with the other team members.  Plan for transition home with hospice this afternoon.  Questions and concerns addressed   SUMMARY OF RECOMMENDATIONS   DNAR/DNI  Transfer home this afternoon on [place] will be paid for privately. Company [employer] representative Butch Penny [phone] can be called directly if any additional questions arive  Time Spent: 35 Greater than 50% of the time was spent in counseling and coordination of care ______________________________________________________________________________________ [place] Team Team Cell Phone: [phone] Please utilize secure chat with additional questions, if there is no response within 30 minutes please call the above phone number  Palliative Medicine Team providers are available by phone from 7am to 7pm daily and can be reached through the team cell phone.   Should this patient require assistance outside of these hours, please call the patient's attending physician.

## 2020-08-09 NOTE — Progress Notes (Signed)
Patient was stable at discharge. I removed their IV. We reviewed the discharge education. Patient/Family verbalized understanding and had no further questions. Patient left with belongings in hand.  

## 2020-08-09 NOTE — TOC Transition Note (Signed)
Transition of Care [facility]) - CM/SW Discharge Note   Patient Details  Name: Carol Chavez MRN: [id number] Date of Birth: [birth year]  Transition of Care [facility]) CM/SW Contact:  Doy Hutching, LCSW Phone Number: 08/09/2020, 10:55 AM   Clinical Narrative:    Plan for pt to d/c home today with home hospice through [facility], DME has been ordered by [facility]. Once it has been settled into pt home [facility] team will complete discharge PTAR. Pt DNR is signed on chart along with PTAR papers.   Final next level of care: Home w Hospice Care Barriers to Discharge: Equipment Delay   Patient Goals and CMS Choice Patient states their goals for this hospitalization and ongoing recovery are:: comfort care; home w/ hospice Choice offered to / list presented to : Spouse, Adult Children  Discharge Placement      Patient chooses bed at: Other - please specify in the comment section below: (home) Patient to be transferred to facility by: PTAR Name of family member notified: pt daughter Tamela Oddi Patient and family notified of of transfer: 08/09/20  Discharge Plan and Services In-house Referral: Clinical Social Work Discharge Planning Services: CM Consult Post Acute Care Choice: Hospice, Durable Medical Equipment           Readmission Risk Interventions Readmission Risk Prevention Plan 08/09/2020  Post Dischage Appt Not Complete  Appt Comments home w/ hospice  Medication Screening Complete  Transportation Screening Complete  Some recent data might be hidden

## 2020-08-09 NOTE — Progress Notes (Signed)
Patient has remained hemodynamically stable.  Temperature 97.6, blood pressure 125/73, heart rate 86, respiratory rate 18, oxygen saturation 99% on 4 L per nasal cannula.  Her physical examination has not changed.  Patient is being discharged today to home with hospice services.  Her husband is at the bedside and all questions have been addressed.

## 2020-08-09 NOTE — Social Work (Addendum)
CSW has put hold on ambulance- I received a call stating that family was planning on returning pt Trilogy vent on Wednesday, it had been discussed that pt would have it for several weeks under private pay on 9/2 with palliative. [facility] seeking clarification before pt discharges about what family's goals of care are regarding Trilogy as they do not cover that past when pt family would pay for it at this time.   CSW called palliative care line to see if we could receive provider support.   Octavio Graves, MSW, LCSW [facility] Clinical Social Work

## 2020-08-19 ENCOUNTER — Ambulatory Visit (HOSPITAL_BASED_OUTPATIENT_CLINIC_OR_DEPARTMENT_OTHER): Admit: 2020-08-19 | Payer: Medicare Other | Admitting: Plastic Surgery

## 2020-08-19 ENCOUNTER — Encounter (HOSPITAL_BASED_OUTPATIENT_CLINIC_OR_DEPARTMENT_OTHER): Payer: Self-pay

## 2020-08-19 DIAGNOSIS — M7989 Other specified soft tissue disorders: Secondary | ICD-10-CM | POA: Diagnosis not present

## 2020-08-19 DIAGNOSIS — R471 Dysarthria and anarthria: Secondary | ICD-10-CM | POA: Diagnosis not present

## 2020-08-19 DIAGNOSIS — R1310 Dysphagia, unspecified: Secondary | ICD-10-CM | POA: Diagnosis not present

## 2020-08-19 DIAGNOSIS — G1221 Amyotrophic lateral sclerosis: Secondary | ICD-10-CM | POA: Diagnosis not present

## 2020-08-19 DIAGNOSIS — R0689 Other abnormalities of breathing: Secondary | ICD-10-CM | POA: Diagnosis not present

## 2020-08-19 SURGERY — REMOVAL, IMPLANT, BREAST
Anesthesia: General | Site: Breast | Laterality: Bilateral

## 2020-08-20 ENCOUNTER — Other Ambulatory Visit: Payer: Self-pay | Admitting: Neurology

## 2020-08-20 ENCOUNTER — Ambulatory Visit
Admission: RE | Admit: 2020-08-20 | Discharge: 2020-08-20 | Disposition: A | Discharge status: Home / Self Care | Payer: Medicare Other | Source: Ambulatory Visit | Attending: Neurology | Admitting: Neurology

## 2020-08-20 ENCOUNTER — Other Ambulatory Visit: Payer: Self-pay

## 2020-08-20 DIAGNOSIS — Z7189 Other specified counseling: Secondary | ICD-10-CM | POA: Diagnosis not present

## 2020-08-20 DIAGNOSIS — M7989 Other specified soft tissue disorders: Secondary | ICD-10-CM

## 2020-08-20 DIAGNOSIS — R1312 Dysphagia, oropharyngeal phase: Secondary | ICD-10-CM | POA: Diagnosis not present

## 2020-08-20 DIAGNOSIS — M6281 Muscle weakness (generalized): Secondary | ICD-10-CM | POA: Diagnosis not present

## 2020-08-20 DIAGNOSIS — G1221 Amyotrophic lateral sclerosis: Secondary | ICD-10-CM | POA: Diagnosis not present

## 2020-08-20 DIAGNOSIS — L89159 Pressure ulcer of sacral region, unspecified stage: Secondary | ICD-10-CM | POA: Diagnosis not present

## 2020-08-21 DIAGNOSIS — Z90710 Acquired absence of both cervix and uterus: Secondary | ICD-10-CM | POA: Diagnosis not present

## 2020-08-21 DIAGNOSIS — G1221 Amyotrophic lateral sclerosis: Secondary | ICD-10-CM | POA: Diagnosis not present

## 2020-08-21 DIAGNOSIS — E785 Hyperlipidemia, unspecified: Secondary | ICD-10-CM | POA: Diagnosis not present

## 2020-08-21 DIAGNOSIS — J9622 Acute and chronic respiratory failure with hypercapnia: Secondary | ICD-10-CM | POA: Diagnosis not present

## 2020-08-21 DIAGNOSIS — J9611 Chronic respiratory failure with hypoxia: Secondary | ICD-10-CM | POA: Diagnosis not present

## 2020-08-21 DIAGNOSIS — Z4659 Encounter for fitting and adjustment of other gastrointestinal appliance and device: Secondary | ICD-10-CM | POA: Diagnosis not present

## 2020-08-21 DIAGNOSIS — Z9842 Cataract extraction status, left eye: Secondary | ICD-10-CM | POA: Diagnosis not present

## 2020-08-21 DIAGNOSIS — F419 Anxiety disorder, unspecified: Secondary | ICD-10-CM | POA: Diagnosis not present

## 2020-08-21 DIAGNOSIS — J9621 Acute and chronic respiratory failure with hypoxia: Secondary | ICD-10-CM | POA: Diagnosis not present

## 2020-08-21 DIAGNOSIS — Z681 Body mass index (BMI) 19 or less, adult: Secondary | ICD-10-CM | POA: Diagnosis not present

## 2020-08-21 DIAGNOSIS — Z934 Other artificial openings of gastrointestinal tract status: Secondary | ICD-10-CM | POA: Diagnosis not present

## 2020-08-21 DIAGNOSIS — R633 Feeding difficulties: Secondary | ICD-10-CM | POA: Diagnosis not present

## 2020-08-21 DIAGNOSIS — J9612 Chronic respiratory failure with hypercapnia: Secondary | ICD-10-CM | POA: Diagnosis not present

## 2020-08-21 DIAGNOSIS — R7303 Prediabetes: Secondary | ICD-10-CM | POA: Diagnosis not present

## 2020-08-21 DIAGNOSIS — Z4682 Encounter for fitting and adjustment of non-vascular catheter: Secondary | ICD-10-CM | POA: Diagnosis not present

## 2020-08-21 DIAGNOSIS — Z9841 Cataract extraction status, right eye: Secondary | ICD-10-CM | POA: Diagnosis not present

## 2020-08-21 DIAGNOSIS — Z931 Gastrostomy status: Secondary | ICD-10-CM | POA: Diagnosis not present

## 2020-08-21 DIAGNOSIS — E43 Unspecified severe protein-calorie malnutrition: Secondary | ICD-10-CM | POA: Diagnosis not present

## 2020-08-21 DIAGNOSIS — Z66 Do not resuscitate: Secondary | ICD-10-CM | POA: Diagnosis not present

## 2020-08-22 DIAGNOSIS — R1310 Dysphagia, unspecified: Secondary | ICD-10-CM | POA: Diagnosis not present

## 2020-08-22 DIAGNOSIS — J9621 Acute and chronic respiratory failure with hypoxia: Secondary | ICD-10-CM | POA: Diagnosis not present

## 2020-08-22 DIAGNOSIS — J9611 Chronic respiratory failure with hypoxia: Secondary | ICD-10-CM | POA: Diagnosis not present

## 2020-08-22 DIAGNOSIS — F419 Anxiety disorder, unspecified: Secondary | ICD-10-CM | POA: Diagnosis not present

## 2020-08-22 DIAGNOSIS — E43 Unspecified severe protein-calorie malnutrition: Secondary | ICD-10-CM | POA: Diagnosis not present

## 2020-08-22 DIAGNOSIS — J9612 Chronic respiratory failure with hypercapnia: Secondary | ICD-10-CM | POA: Diagnosis not present

## 2020-08-22 DIAGNOSIS — Z90710 Acquired absence of both cervix and uterus: Secondary | ICD-10-CM | POA: Diagnosis not present

## 2020-08-22 DIAGNOSIS — Z4659 Encounter for fitting and adjustment of other gastrointestinal appliance and device: Secondary | ICD-10-CM | POA: Diagnosis not present

## 2020-08-22 DIAGNOSIS — R7303 Prediabetes: Secondary | ICD-10-CM | POA: Diagnosis not present

## 2020-08-22 DIAGNOSIS — Z9841 Cataract extraction status, right eye: Secondary | ICD-10-CM | POA: Diagnosis not present

## 2020-08-22 DIAGNOSIS — G1221 Amyotrophic lateral sclerosis: Secondary | ICD-10-CM | POA: Diagnosis not present

## 2020-08-22 DIAGNOSIS — Z934 Other artificial openings of gastrointestinal tract status: Secondary | ICD-10-CM | POA: Diagnosis not present

## 2020-08-22 DIAGNOSIS — Z681 Body mass index (BMI) 19 or less, adult: Secondary | ICD-10-CM | POA: Diagnosis not present

## 2020-08-22 DIAGNOSIS — Z7409 Other reduced mobility: Secondary | ICD-10-CM | POA: Diagnosis not present

## 2020-08-22 DIAGNOSIS — Z9842 Cataract extraction status, left eye: Secondary | ICD-10-CM | POA: Diagnosis not present

## 2020-08-22 DIAGNOSIS — E785 Hyperlipidemia, unspecified: Secondary | ICD-10-CM | POA: Diagnosis not present

## 2020-08-22 DIAGNOSIS — Z66 Do not resuscitate: Secondary | ICD-10-CM | POA: Diagnosis not present

## 2020-08-23 DIAGNOSIS — Z4659 Encounter for fitting and adjustment of other gastrointestinal appliance and device: Secondary | ICD-10-CM | POA: Diagnosis not present

## 2020-08-23 DIAGNOSIS — Z9841 Cataract extraction status, right eye: Secondary | ICD-10-CM | POA: Diagnosis not present

## 2020-08-23 DIAGNOSIS — J9612 Chronic respiratory failure with hypercapnia: Secondary | ICD-10-CM | POA: Diagnosis not present

## 2020-08-23 DIAGNOSIS — Z934 Other artificial openings of gastrointestinal tract status: Secondary | ICD-10-CM | POA: Diagnosis not present

## 2020-08-23 DIAGNOSIS — Z66 Do not resuscitate: Secondary | ICD-10-CM | POA: Diagnosis not present

## 2020-08-23 DIAGNOSIS — G1221 Amyotrophic lateral sclerosis: Secondary | ICD-10-CM | POA: Diagnosis not present

## 2020-08-23 DIAGNOSIS — R1310 Dysphagia, unspecified: Secondary | ICD-10-CM | POA: Diagnosis not present

## 2020-08-23 DIAGNOSIS — R7303 Prediabetes: Secondary | ICD-10-CM | POA: Diagnosis not present

## 2020-08-23 DIAGNOSIS — Z90710 Acquired absence of both cervix and uterus: Secondary | ICD-10-CM | POA: Diagnosis not present

## 2020-08-23 DIAGNOSIS — Z9842 Cataract extraction status, left eye: Secondary | ICD-10-CM | POA: Diagnosis not present

## 2020-08-23 DIAGNOSIS — Z681 Body mass index (BMI) 19 or less, adult: Secondary | ICD-10-CM | POA: Diagnosis not present

## 2020-08-23 DIAGNOSIS — F419 Anxiety disorder, unspecified: Secondary | ICD-10-CM | POA: Diagnosis not present

## 2020-08-23 DIAGNOSIS — J9621 Acute and chronic respiratory failure with hypoxia: Secondary | ICD-10-CM | POA: Diagnosis not present

## 2020-08-23 DIAGNOSIS — E785 Hyperlipidemia, unspecified: Secondary | ICD-10-CM | POA: Diagnosis not present

## 2020-08-23 DIAGNOSIS — E43 Unspecified severe protein-calorie malnutrition: Secondary | ICD-10-CM | POA: Diagnosis not present

## 2020-08-23 DIAGNOSIS — J9611 Chronic respiratory failure with hypoxia: Secondary | ICD-10-CM | POA: Diagnosis not present

## 2020-08-24 DIAGNOSIS — F419 Anxiety disorder, unspecified: Secondary | ICD-10-CM | POA: Diagnosis not present

## 2020-08-24 DIAGNOSIS — Z9841 Cataract extraction status, right eye: Secondary | ICD-10-CM | POA: Diagnosis not present

## 2020-08-24 DIAGNOSIS — R1310 Dysphagia, unspecified: Secondary | ICD-10-CM | POA: Diagnosis not present

## 2020-08-24 DIAGNOSIS — Z681 Body mass index (BMI) 19 or less, adult: Secondary | ICD-10-CM | POA: Diagnosis not present

## 2020-08-24 DIAGNOSIS — Z90710 Acquired absence of both cervix and uterus: Secondary | ICD-10-CM | POA: Diagnosis not present

## 2020-08-24 DIAGNOSIS — E785 Hyperlipidemia, unspecified: Secondary | ICD-10-CM | POA: Diagnosis not present

## 2020-08-24 DIAGNOSIS — Z66 Do not resuscitate: Secondary | ICD-10-CM | POA: Diagnosis not present

## 2020-08-24 DIAGNOSIS — J9611 Chronic respiratory failure with hypoxia: Secondary | ICD-10-CM | POA: Diagnosis not present

## 2020-08-24 DIAGNOSIS — E43 Unspecified severe protein-calorie malnutrition: Secondary | ICD-10-CM | POA: Diagnosis not present

## 2020-08-24 DIAGNOSIS — R7303 Prediabetes: Secondary | ICD-10-CM | POA: Diagnosis not present

## 2020-08-24 DIAGNOSIS — Z9842 Cataract extraction status, left eye: Secondary | ICD-10-CM | POA: Diagnosis not present

## 2020-08-24 DIAGNOSIS — G1221 Amyotrophic lateral sclerosis: Secondary | ICD-10-CM | POA: Diagnosis not present

## 2020-08-24 DIAGNOSIS — Z934 Other artificial openings of gastrointestinal tract status: Secondary | ICD-10-CM | POA: Diagnosis not present

## 2020-08-24 DIAGNOSIS — J9621 Acute and chronic respiratory failure with hypoxia: Secondary | ICD-10-CM | POA: Diagnosis not present

## 2020-08-24 DIAGNOSIS — J9612 Chronic respiratory failure with hypercapnia: Secondary | ICD-10-CM | POA: Diagnosis not present

## 2020-08-24 DIAGNOSIS — Z4659 Encounter for fitting and adjustment of other gastrointestinal appliance and device: Secondary | ICD-10-CM | POA: Diagnosis not present

## 2020-08-27 DIAGNOSIS — G1221 Amyotrophic lateral sclerosis: Secondary | ICD-10-CM | POA: Diagnosis not present

## 2020-08-28 ENCOUNTER — Encounter: Payer: Medicare Other | Admitting: Surgical

## 2020-08-28 DIAGNOSIS — J9602 Acute respiratory failure with hypercapnia: Secondary | ICD-10-CM | POA: Diagnosis not present

## 2020-08-28 DIAGNOSIS — Z9842 Cataract extraction status, left eye: Secondary | ICD-10-CM | POA: Diagnosis not present

## 2020-08-28 DIAGNOSIS — G1221 Amyotrophic lateral sclerosis: Secondary | ICD-10-CM | POA: Diagnosis not present

## 2020-08-28 DIAGNOSIS — G629 Polyneuropathy, unspecified: Secondary | ICD-10-CM | POA: Diagnosis not present

## 2020-08-28 DIAGNOSIS — R7303 Prediabetes: Secondary | ICD-10-CM | POA: Diagnosis not present

## 2020-08-28 DIAGNOSIS — Z90711 Acquired absence of uterus with remaining cervical stump: Secondary | ICD-10-CM | POA: Diagnosis not present

## 2020-08-28 DIAGNOSIS — E785 Hyperlipidemia, unspecified: Secondary | ICD-10-CM | POA: Diagnosis not present

## 2020-08-28 DIAGNOSIS — F419 Anxiety disorder, unspecified: Secondary | ICD-10-CM | POA: Diagnosis not present

## 2020-08-28 DIAGNOSIS — M21372 Foot drop, left foot: Secondary | ICD-10-CM | POA: Diagnosis not present

## 2020-08-28 DIAGNOSIS — Z9841 Cataract extraction status, right eye: Secondary | ICD-10-CM | POA: Diagnosis not present

## 2020-08-28 DIAGNOSIS — Z9981 Dependence on supplemental oxygen: Secondary | ICD-10-CM | POA: Diagnosis not present

## 2020-08-28 DIAGNOSIS — Z961 Presence of intraocular lens: Secondary | ICD-10-CM | POA: Diagnosis not present

## 2020-08-28 DIAGNOSIS — Z431 Encounter for attention to gastrostomy: Secondary | ICD-10-CM | POA: Diagnosis not present

## 2020-08-28 DIAGNOSIS — H9190 Unspecified hearing loss, unspecified ear: Secondary | ICD-10-CM | POA: Diagnosis not present

## 2020-08-28 DIAGNOSIS — J9601 Acute respiratory failure with hypoxia: Secondary | ICD-10-CM | POA: Diagnosis not present

## 2020-08-30 DIAGNOSIS — E43 Unspecified severe protein-calorie malnutrition: Secondary | ICD-10-CM | POA: Diagnosis not present

## 2020-08-30 DIAGNOSIS — R0689 Other abnormalities of breathing: Secondary | ICD-10-CM | POA: Diagnosis not present

## 2020-08-30 DIAGNOSIS — G1221 Amyotrophic lateral sclerosis: Secondary | ICD-10-CM | POA: Diagnosis not present

## 2020-08-30 DIAGNOSIS — G238 Other specified degenerative diseases of basal ganglia: Secondary | ICD-10-CM | POA: Diagnosis not present

## 2020-08-30 DIAGNOSIS — Z931 Gastrostomy status: Secondary | ICD-10-CM | POA: Diagnosis not present

## 2020-08-30 DIAGNOSIS — M47819 Spondylosis without myelopathy or radiculopathy, site unspecified: Secondary | ICD-10-CM | POA: Diagnosis not present

## 2020-08-30 DIAGNOSIS — R1310 Dysphagia, unspecified: Secondary | ICD-10-CM | POA: Diagnosis not present

## 2020-08-30 DIAGNOSIS — F419 Anxiety disorder, unspecified: Secondary | ICD-10-CM | POA: Diagnosis not present

## 2020-08-30 DIAGNOSIS — G1220 Motor neuron disease, unspecified: Secondary | ICD-10-CM | POA: Diagnosis not present

## 2020-08-30 DIAGNOSIS — R531 Weakness: Secondary | ICD-10-CM | POA: Diagnosis not present

## 2020-08-30 DIAGNOSIS — R471 Dysarthria and anarthria: Secondary | ICD-10-CM | POA: Diagnosis not present

## 2020-09-04 ENCOUNTER — Encounter: Payer: Medicare Other | Admitting: Plastic Surgery

## 2020-09-08 DIAGNOSIS — G1221 Amyotrophic lateral sclerosis: Secondary | ICD-10-CM | POA: Diagnosis not present

## 2020-09-08 DIAGNOSIS — R1310 Dysphagia, unspecified: Secondary | ICD-10-CM | POA: Diagnosis not present

## 2020-09-08 DIAGNOSIS — E43 Unspecified severe protein-calorie malnutrition: Secondary | ICD-10-CM | POA: Diagnosis not present

## 2020-09-09 ENCOUNTER — Telehealth: Payer: Self-pay | Admitting: Nurse Practitioner

## 2020-09-09 NOTE — Telephone Encounter (Signed)
Spoke with patient's daughter, Tamela Oddi, regarding Palliative referral and she requested that I contact patient's husband to schedule.  I then called and spoke with patient's husband, Geofrey, regarding Palliative services and all questions were answered and he was in agreement with scheduling visit.  I scheduled an In-person Consult for 09/13/20 @ 10:30 AM.

## 2020-09-13 ENCOUNTER — Other Ambulatory Visit: Payer: Self-pay

## 2020-09-13 ENCOUNTER — Other Ambulatory Visit: Payer: Medicare Other | Admitting: Nurse Practitioner

## 2020-09-16 ENCOUNTER — Other Ambulatory Visit: Payer: Self-pay

## 2020-09-16 ENCOUNTER — Other Ambulatory Visit: Payer: Medicare Other | Admitting: Nurse Practitioner

## 2020-09-16 DIAGNOSIS — Z515 Encounter for palliative care: Secondary | ICD-10-CM

## 2020-09-16 DIAGNOSIS — R143 Flatulence: Secondary | ICD-10-CM | POA: Diagnosis not present

## 2020-09-16 DIAGNOSIS — Z7189 Other specified counseling: Secondary | ICD-10-CM | POA: Diagnosis not present

## 2020-09-16 NOTE — Progress Notes (Signed)
[facility] Consult Note Telephone: [phone]  Fax: [phone]  PATIENT NAME: Carol Chavez [address] [place] [address] [phone] (home)  DOB: [birth year] MRN: [id number]  PRIMARY CARE PROVIDER:    Dr Mike Gip House call [phone]  REFERRING PROVIDER:  Dr. Larence Penning, [place] [phone] (Fax)   RESPONSIBLE PARTY:   Extended Emergency Contact Information Primary Emergency Contact: [facility] Address: [place], [place] [address] [place] Work Phone: [phone] Mobile Phone: [phone] Relation: Spouse Secondary Emergency Contact: Alen,Betsy Mobile Phone: [phone] Relation: Daughter  I met face to face with patient and daughter Carol Chavez in home.  ASSESSMENT AND RECOMMENDATIONS:   1. Advance Care Planning/Goals of Care:  Today's visit consisted of building trust and discussions on Palliative Care Medicine as specialized medical care for people living with serious illness, with goal of facilitating better quality of life through symptoms management, assisting with advance care planning and establishing goals of care. Patient and daughter expressed apreciation for the visit, and acknowleged that they had a family member who benefited from Hospice care services. Family expressed appreciation for education provided on Palliative care and how that differs from Hospice care service.  Goal of care: Goal of care is function. Patient verbalized goal is to walk again and eat somewhat normal. Patient verbalized awareness that it may be slow, but she is working to achieve the goal.  Directives: Patient has a DNR form in the house. She verbalized desire to not be resuscitated in the event of a cardiac or respiratory arrest. MOST form discussed, reviewed sections of the form. Blank form left with patient, to discuss with her family, we will complete at next visit  or when patient is ready. Copy of signed DNR form on [place] EMR.  2. Symptom Management: Patient with recent diagnosis of ALS followed by ALS clinic at [facility].  Dysphagia: MBSS from 07/23/2020 showed moderate dysphagia. Patient on Dysphagia 4 diet, on enteral feeding to supplement her meals. She report ability to eat some soft food and thin liquids. She report her breakfast this morning was half a banana and some apple sauce. She report occasional cough during meal, verbalized awareness of increased risk of aspiration and practices safe swallowing techniques. Patient had PEG tube placed on 08/21/2020, she is on bolus feeds 4 times a day, she is currently on Jevity 1.5 cal with Fiber, she is intolerant to Osmolite which she reported caused her diarrhea. She continues to to have problem with Gas and flatulence. She however denied abdominal pain, cramps, nausea or vomiting. She has used Maalox without success as anything that has Sorbitol causes her diarrhea. Patient unable to take Simethicone due it's sorbitol content. Patient report taking a gas prevention probiotics that contains the enzyme Alpha-galactosidate, she report fair relief of symptoms. Recommend monitoring patient for worsening symptoms of dysphagia/aspiration with adjustment to plan of care as needed to meet goal of symptom control including weight loss and decreased incidence of aspiration. Dysarthria: Patient is seen by speech therapist 2 timesa a week. She has a voice amplifier that she has not yet has the need to use. Recommend supportive care with goal of preservation of good quality of life through verbal and non verbal communication. Muscle weakness: Patient is currently wheel chair dependent. She is able to wheel her self within her home which she is hoping to maintain. She does not think she needs  a motorized wheelchair, as she want to maintain as much physical activity as she can. No report of skin breakdown or wound, no report of  falls.  Respiratory function decline: Patient uses Trilogy during sleep and cough assist device. Patient report feeling much stronger since it's use. Patient on sugar free Geri Tussin decongestant for cough/congestion. Patient report feeling better, denied insomnia, denied anxiety. Report no longer taking Ativan. Palliative care will continue to provide support to patient, family and the medical team.   3. Follow up Palliative Care Visit: Palliative care will continue to follow for goals of care clarification and symptom management. Return in about 4 weeks or prn.  4. Family /Caregiver/Community Supports: Patient lives at home with her husband. She has 4 children, 3 daughters and one son. Family very involved in her care. Children and her husband take turns daily to assist in her care. Patient report keeping herself busy by watching TV, speaking with her children on phone, and having friends visit. 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  5. Cognitive / Functional decline: Patient alert and awake, able to make her own decisions. She is dependent on manual wheelchair for ambulation, she can wheel self around.  She requires assist with her bathing and dressing, able to use the bathroom independently for bowel and bladder. Patient has a voice amplifier which she has not needed yet as her voice is still clear and audible. She receives home health, PT, OT, and ST from [place]. PT anf OT comes once aweek, ST comes  twice a week. [place] home nurse comes once a week.   Palliative care will continue to provide support to patient, family and the medical team.   I spent 80 minutes providing this consultation, time includes time spent with patient and her family, chart review, and documentation. More than 50% of the time in this consultation was spent coordinating communication.   HISTORY OF PRESENT ILLNESS:  Carol Chavez is a 74 y.o. year old female with multiple medical problems  including motor neuron disease, dysphagia and dysarthria related to ALS, anxiety, hearing loss, HLD, peripheral neuropathy. Palliative Care was asked to follow this patient by consultation request of Dr. Vivianne Master to help address advance care planning and goals of care.  This is an initial visit.  CODE STATUS: DNR  PPS: 50%  HOSPICE ELIGIBILITY/DIAGNOSIS: TBD  PAST MEDICAL HISTORY:  Past Medical History:  Diagnosis Date  . Balance problem   . Foot drop, left   . GERD (gastroesophageal reflux disease)   . Memory changes    pt denies  . Vitamin D deficiency    pt denies    SOCIAL HX:  Social History   Tobacco Use  . Smoking status: Never Smoker  . Smokeless tobacco: Never Used  Substance Use Topics  . Alcohol use: Yes    Comment: occ   FAMILY HX:  Family History  Problem Relation Age of Onset  . Breast cancer Mother     ALLERGIES: No Known Allergies   PERTINENT MEDICATIONS:  Riluzole (Rilutek) 94m tablet PO BID Gerri Tussin as needed for cough and congestion Loperamide as needed for diarrhea  PHYSICAL EXAM / ROS:   Current and past weights: 96lbs, 572f", BMI 17kg/m2, baseline weight 120lbs. General: frail appearing, thin, sitting in wheelchair in NAD Cardiovascular: denied chest pain, no edema, tachycardic HR 102 Pulmonary: no cough, no increased SOB, room air Abdomen: appetite fair, endorses constipation, continent of bowel GU: denies dysuria, continent  of urine MSK:  Mild foot drop to left foot, no other joint and ROM abnormalities, ambulatory Skin: no rashes or wounds reported reported, cold lower extremities. Neurological: Weakness, but otherwise nonfocal  Jari Favre , DNP, AGPCNP-BC

## 2020-09-18 DIAGNOSIS — G1221 Amyotrophic lateral sclerosis: Secondary | ICD-10-CM | POA: Diagnosis not present

## 2020-09-21 DIAGNOSIS — R1312 Dysphagia, oropharyngeal phase: Secondary | ICD-10-CM | POA: Diagnosis not present

## 2020-09-21 DIAGNOSIS — G1221 Amyotrophic lateral sclerosis: Secondary | ICD-10-CM | POA: Diagnosis not present

## 2020-09-26 DIAGNOSIS — G1221 Amyotrophic lateral sclerosis: Secondary | ICD-10-CM | POA: Diagnosis not present

## 2020-09-27 DIAGNOSIS — R1312 Dysphagia, oropharyngeal phase: Secondary | ICD-10-CM | POA: Diagnosis not present

## 2020-09-27 DIAGNOSIS — M6281 Muscle weakness (generalized): Secondary | ICD-10-CM | POA: Diagnosis not present

## 2020-09-27 DIAGNOSIS — F419 Anxiety disorder, unspecified: Secondary | ICD-10-CM | POA: Diagnosis not present

## 2020-09-27 DIAGNOSIS — M545 Low back pain, unspecified: Secondary | ICD-10-CM | POA: Diagnosis not present

## 2020-10-01 DIAGNOSIS — J9601 Acute respiratory failure with hypoxia: Secondary | ICD-10-CM | POA: Diagnosis not present

## 2020-10-01 DIAGNOSIS — Z9842 Cataract extraction status, left eye: Secondary | ICD-10-CM | POA: Diagnosis not present

## 2020-10-01 DIAGNOSIS — G1221 Amyotrophic lateral sclerosis: Secondary | ICD-10-CM | POA: Diagnosis not present

## 2020-10-01 DIAGNOSIS — Z90711 Acquired absence of uterus with remaining cervical stump: Secondary | ICD-10-CM | POA: Diagnosis not present

## 2020-10-01 DIAGNOSIS — Z9981 Dependence on supplemental oxygen: Secondary | ICD-10-CM | POA: Diagnosis not present

## 2020-10-01 DIAGNOSIS — Z431 Encounter for attention to gastrostomy: Secondary | ICD-10-CM | POA: Diagnosis not present

## 2020-10-01 DIAGNOSIS — R7303 Prediabetes: Secondary | ICD-10-CM | POA: Diagnosis not present

## 2020-10-01 DIAGNOSIS — E785 Hyperlipidemia, unspecified: Secondary | ICD-10-CM | POA: Diagnosis not present

## 2020-10-01 DIAGNOSIS — H9190 Unspecified hearing loss, unspecified ear: Secondary | ICD-10-CM | POA: Diagnosis not present

## 2020-10-01 DIAGNOSIS — M21372 Foot drop, left foot: Secondary | ICD-10-CM | POA: Diagnosis not present

## 2020-10-01 DIAGNOSIS — J9602 Acute respiratory failure with hypercapnia: Secondary | ICD-10-CM | POA: Diagnosis not present

## 2020-10-01 DIAGNOSIS — F419 Anxiety disorder, unspecified: Secondary | ICD-10-CM | POA: Diagnosis not present

## 2020-10-01 DIAGNOSIS — G629 Polyneuropathy, unspecified: Secondary | ICD-10-CM | POA: Diagnosis not present

## 2020-10-01 DIAGNOSIS — Z9841 Cataract extraction status, right eye: Secondary | ICD-10-CM | POA: Diagnosis not present

## 2020-10-01 DIAGNOSIS — Z961 Presence of intraocular lens: Secondary | ICD-10-CM | POA: Diagnosis not present

## 2020-10-02 DIAGNOSIS — R1310 Dysphagia, unspecified: Secondary | ICD-10-CM | POA: Diagnosis not present

## 2020-10-02 DIAGNOSIS — E43 Unspecified severe protein-calorie malnutrition: Secondary | ICD-10-CM | POA: Diagnosis not present

## 2020-10-02 DIAGNOSIS — G1221 Amyotrophic lateral sclerosis: Secondary | ICD-10-CM | POA: Diagnosis not present

## 2020-10-16 ENCOUNTER — Other Ambulatory Visit: Payer: Medicare Other | Admitting: Nurse Practitioner

## 2020-10-16 ENCOUNTER — Other Ambulatory Visit: Payer: Self-pay

## 2020-10-16 DIAGNOSIS — Z7189 Other specified counseling: Secondary | ICD-10-CM | POA: Diagnosis not present

## 2020-10-16 DIAGNOSIS — Z515 Encounter for palliative care: Secondary | ICD-10-CM | POA: Diagnosis not present

## 2020-10-16 DIAGNOSIS — R1319 Other dysphagia: Secondary | ICD-10-CM

## 2020-10-16 NOTE — Progress Notes (Signed)
[facility] Consult Note Telephone: [phone]  Fax: [phone]  PATIENT NAME: Carol Chavez [address] [place] [address] [phone] (home)  DOB: Dec 10, 1945 MRN: [id number]  PRIMARY CARE PROVIDER:    Dr Mike Gip House call [phone]  REFERRING PROVIDER:   Dr. Larence Penning, [place] [phone] (Fax)  RESPONSIBLE PARTY:   Extended Emergency Contact Information Primary Emergency Contact: [facility] Address: [place], [place] [address] [place] Work Phone: [phone] Mobile Phone: [phone] Relation: Spouse Secondary Emergency Contact: Pe,Betsy Mobile Phone: [phone] Relation: Daughter  I met face to face with patient and family in home.  ASSESSMENT AND RECOMMENDATIONS:   1. Advance Care Planning: Goal of care: Goal of care is function. Patient verbalized desire to be able to walk again and eat somewhat normal. Patient is however aware that her diease is progressive. Directives: Patient's code status is DNR. Signed DNR form in home and on [facility] EMR. Blank MOST form was left with patient to discuss with family at last visit. Patient expressed readiness to complete form today. Patient's daughters [name] and Laurey Arrow called in via Telephone during visit. Discussed and reviewed sections of the MOST form with patient and family in detail, opportunity given for questions, all questions answered. Form completed and given to patient to keep in home, copy uploaded to [facility] EMR. Details of the MOST include; limited additional intervention, antibiotics if indicated, IV fluids for a defined trial period, feeding tube long-term if indicated.  Palliative care will continue to provide support to patient, family and the medical team.  2. Symptom Management: Patient with recent diagnosis of ALS followed by ALS clinic at [facility], patient of Dr.  Vivianne Master. Respiratory function decline: Patient uses Trilogy during sleep and a cough assist device. Patient report air leak in its mask, saying it makes a blowing sound. Patient report the air leak is noisy and it is affecting her quality of sleep. Patient currently uses a small size mask, husband believed she may need medium size, family will reach out to the device supplier for assistance.  Dysphagia: MBSS from 07/23/2020 showed moderate dysphagia. Patient on Dysphagia 4 diet, on enteral feeding to supplement her oral intake. Patient had PEG tube placed on 08/21/2020, she is on bolus feeds 5 times a day, she is currently on 240m of Jevity 1.5 cal with Fiber, she is intolerant to Osmolite which she reported caused her diarrhea. Patient husband expressed dissatisfaction with Jevity manufacturer saying the content in the carton is 2019m and not 23736ms stated on the label. Patient report increased difficulty with swallowing, patient report oral secretion feels thicker and throat feels dry with resultant difficulty swallowing as she feels secretion in her throat is dry and sticky. She report passing swallow test with speech therapist 2 days ago. Patient receives free water between feedings, amount was recently increased due to concern for dehydration. Patient uses Biotin mouth rinse with some relief of the dryness in her mouth, asked what she can do to relief the dryness in her throat. Patient report weight gain since last visit. Recommendation: Patient to continue Biotin oral rinse, may consider using the oral spray formulation. Advised that patient sip on warm fluid intermittently through out the day while awake. Dysarthria: Patient is seen by speech therapist 2 times a week. She has a voice amplifier that she has not yet has the need to use.  Her speech is still clear, however noted to be weaker today. Continue supportive care with goal of preservation of good quality of life through verbal and non verbal  communication. Diarrhea: Patient report loose stools which occurs one or twice a day. She report episode is usually precipitated by gassy feeling. She report taking Imodium as needed with relief. Family report occasionally attaching empty syringe to Peg tube to let gas out, patient report relief of gas with this procedure. Patient report having 3-4 bowel movements in a day, and that some of them are formed stool.  Recommendation: Patient to continue gravity feeding to prevent air trapping, continue burping. Patient may take imodium if more than 2 loose stools in a day.   3. Follow up Palliative Care Visit: Palliative care will continue to follow for goals of care clarification and symptom management. Return in about 4 weeks or prn.  4. Family /Caregiver/Community Supports: Patient lives at home with her husband. She has 4 children, 3 daughters and one son. Family very involved in her care. Children and her husband take turns daily to assist in her care. Patient report keeping herself busy by watching TV, speaking with her children on phone, and having friends visit. 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 Patient's husband is a Visual merchandiser at [employer].   5. Cognitive / Functional decline: Patient alert and awake, able to make her own decisions. She is dependent on manual wheelchair for ambulation, can wheel self around. She requires assistance with her bathing and dressing, able to use the bathroom independently for bowel and bladder. Patient has a voice amplifier which she has not needed yet as her voice is still clear and audible, her voice is however weaker today. She receives home health, PT, OT, and ST from [place]. PT anf OT comes once aweek, ST comes  twice a week. [place] home nurse comes once a week. No report of falls.  I spent 60 minutes providing this consultation, time includes time spent with patient and family, chart review, provider coordination,  and documentation. More than 50% of the time in this consultation was spent coordinating communication.   HISTORY OF PRESENT ILLNESS:  Carol Chavez is a 74 y.o. year old female with multiple medical problems including motor neuron disease, dysphagia and dysarthria related to ALS, anxiety, hearing loss, HLD, peripheral neuropathy. Palliative Care was asked to help address advance care planning and goals of care. This is a follow up visit form 09/16/2020.  CODE STATUS: DNR  PPS: 50%  HOSPICE ELIGIBILITY/DIAGNOSIS: TBD  PHYSICAL EXAM / ROS:   Current and past weights: 99.5lbs up from 96lbs at last visit a month ago, Ht 51f 3", BMI 17.6kg/m2 General: NAD, frail appearing, thin, sitting in wheelchair in her kitchen participating in discussion Cardiovascular: denied chest pain, denied palpitation, no edema, S1S2 normal Pulmonary: no cough, no increased SOB, 92% on room air GI:  appetite poor, denied constipation, continent of bowel GU: denies dysuria, continent of urine MSK: no joint and ROM abnormalities, wheelchair dependent Skin: no rashes or wounds reported or noted on exposed. Neurological: Weakness, but otherwise nonfocal  PAST MEDICAL HISTORY:  Past Medical History:  Diagnosis Date  . Balance problem   . Foot drop, left   . GERD (gastroesophageal reflux disease)   . Memory changes    pt denies  . Vitamin D deficiency    pt denies    SOCIAL HX:  Social History   Tobacco Use  .  Smoking status: Never Smoker  . Smokeless tobacco: Never Used  Substance Use Topics  . Alcohol use: Yes    Comment: occ   FAMILY HX:  Family History  Problem Relation Age of Onset  . Breast cancer Mother    ALLERGIES: No Known Allergies   PERTINENT MEDICATIONS:  Outpatient Encounter Medications as of 10/16/2020  Medication Sig  . Cholecalciferol (VITAMIN D PO) Take 3 capsules by mouth daily.   Marland Kitchen ibuprofen (ADVIL) 200 MG tablet Take 600 mg by mouth 2 (two) times daily.  Marland Kitchen LORazepam (ATIVAN)  0.5 MG tablet Take 1 tablet (0.5 mg total) by mouth every 6 (six) hours as needed for anxiety.  Marland Kitchen MAGNESIUM PO Take 2 tablets by mouth 2 (two) times daily.   . Morphine Sulfate (MORPHINE CONCENTRATE) 10 MG/0.5ML SOLN concentrated solution Take 0.5 mLs (10 mg total) by mouth every 2 (two) hours as needed for moderate pain, severe pain or shortness of breath.  . riluzole (RILUTEK) 50 MG tablet Take 50 mg by mouth 2 (two) times daily.  Marland Kitchen VITAMIN E PO Take 1 tablet by mouth daily.   No facility-administered encounter medications on file as of 10/16/2020.    Jari Favre, DNP, AGPCNP-BC

## 2020-10-19 DIAGNOSIS — G1221 Amyotrophic lateral sclerosis: Secondary | ICD-10-CM | POA: Diagnosis not present

## 2020-10-27 DIAGNOSIS — G1221 Amyotrophic lateral sclerosis: Secondary | ICD-10-CM | POA: Diagnosis not present

## 2020-10-28 DIAGNOSIS — R1312 Dysphagia, oropharyngeal phase: Secondary | ICD-10-CM | POA: Diagnosis not present

## 2020-10-28 DIAGNOSIS — M545 Low back pain, unspecified: Secondary | ICD-10-CM | POA: Diagnosis not present

## 2020-10-28 DIAGNOSIS — G1221 Amyotrophic lateral sclerosis: Secondary | ICD-10-CM | POA: Diagnosis not present

## 2020-10-28 DIAGNOSIS — F419 Anxiety disorder, unspecified: Secondary | ICD-10-CM | POA: Diagnosis not present

## 2020-10-29 DIAGNOSIS — E785 Hyperlipidemia, unspecified: Secondary | ICD-10-CM | POA: Diagnosis not present

## 2020-10-29 DIAGNOSIS — J9622 Acute and chronic respiratory failure with hypercapnia: Secondary | ICD-10-CM | POA: Diagnosis not present

## 2020-10-29 DIAGNOSIS — R7303 Prediabetes: Secondary | ICD-10-CM | POA: Diagnosis not present

## 2020-10-29 DIAGNOSIS — Z431 Encounter for attention to gastrostomy: Secondary | ICD-10-CM | POA: Diagnosis not present

## 2020-10-29 DIAGNOSIS — G1221 Amyotrophic lateral sclerosis: Secondary | ICD-10-CM | POA: Diagnosis not present

## 2020-10-29 DIAGNOSIS — Z9981 Dependence on supplemental oxygen: Secondary | ICD-10-CM | POA: Diagnosis not present

## 2020-10-29 DIAGNOSIS — J9621 Acute and chronic respiratory failure with hypoxia: Secondary | ICD-10-CM | POA: Diagnosis not present

## 2020-10-29 DIAGNOSIS — G629 Polyneuropathy, unspecified: Secondary | ICD-10-CM | POA: Diagnosis not present

## 2020-10-29 DIAGNOSIS — M21372 Foot drop, left foot: Secondary | ICD-10-CM | POA: Diagnosis not present

## 2020-10-29 DIAGNOSIS — H9190 Unspecified hearing loss, unspecified ear: Secondary | ICD-10-CM | POA: Diagnosis not present

## 2020-10-29 DIAGNOSIS — E43 Unspecified severe protein-calorie malnutrition: Secondary | ICD-10-CM | POA: Diagnosis not present

## 2020-10-29 DIAGNOSIS — Z90711 Acquired absence of uterus with remaining cervical stump: Secondary | ICD-10-CM | POA: Diagnosis not present

## 2020-10-29 DIAGNOSIS — F419 Anxiety disorder, unspecified: Secondary | ICD-10-CM | POA: Diagnosis not present

## 2020-10-29 DIAGNOSIS — R1310 Dysphagia, unspecified: Secondary | ICD-10-CM | POA: Diagnosis not present

## 2020-10-29 DIAGNOSIS — Z9842 Cataract extraction status, left eye: Secondary | ICD-10-CM | POA: Diagnosis not present

## 2020-10-29 DIAGNOSIS — Z9841 Cataract extraction status, right eye: Secondary | ICD-10-CM | POA: Diagnosis not present

## 2020-11-02 IMAGING — XA DG SPINAL PUNCT LUMBAR DIAG WITH FL CT GUIDANCE
1 series · 1 of 1 positions shown · non-contrast
Comparison: none

CLINICAL DATA: Neuropathy.  Weakness.

[Series 1: ortho adipose · 1 of 1 slices shown]
[im 1/1]
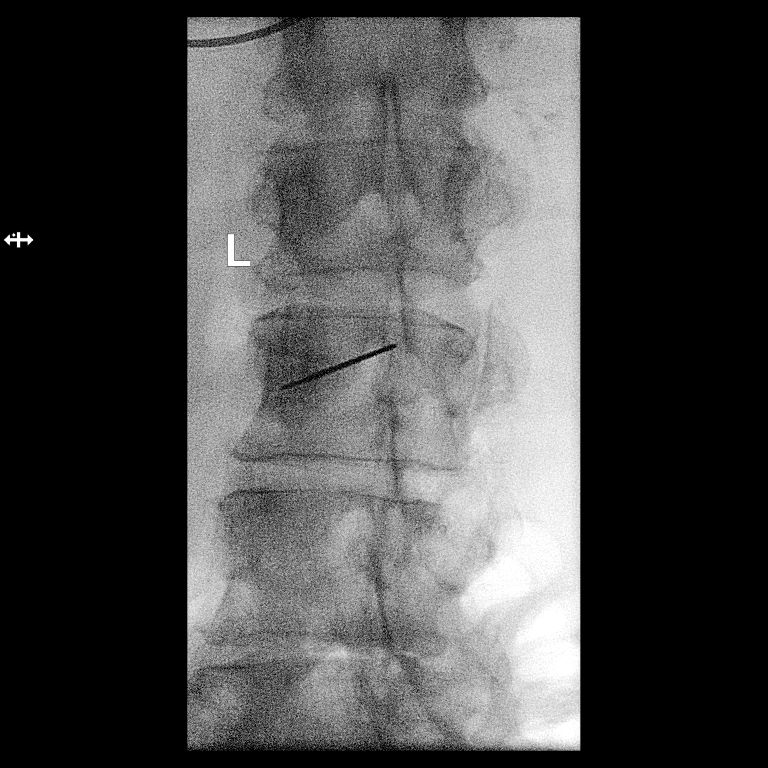

[1 of 1 positions shown; findings below may reference images not displayed]

EXAM:
DIAGNOSTIC LUMBAR PUNCTURE UNDER FLUOROSCOPIC GUIDANCE

FLUOROSCOPY TIME:  Fluoroscopy Time:  9 seconds

Radiation Exposure Index (if provided by the fluoroscopic device):
0.40 mGy

Number of Acquired Spot Images: 0

PROCEDURE:
Informed consent was obtained from the patient prior to the
procedure, including potential complications of headache, allergy,
and pain. With the patient in the left lateral decubitus position,
the lower back was prepped with Betadine. 1% Lidocaine was used for
local anesthesia. Lumbar puncture was performed at the L2-3 level
using a 3.5 inch 20 gauge needle via a left interlaminar approach
with return of clear CSF with an opening pressure of 14 cm water. 9
mL of CSF were obtained for laboratory studies. The patient
tolerated the procedure well and there were no apparent
complications.
IMPRESSION: Successful fluoroscopically guided lumbar puncture.

## 2020-11-06 DIAGNOSIS — G1221 Amyotrophic lateral sclerosis: Secondary | ICD-10-CM | POA: Diagnosis not present

## 2020-11-12 ENCOUNTER — Telehealth: Payer: Self-pay | Admitting: Nurse Practitioner

## 2020-11-13 ENCOUNTER — Other Ambulatory Visit: Payer: Self-pay

## 2020-11-13 ENCOUNTER — Other Ambulatory Visit: Payer: Medicare Other | Admitting: Nurse Practitioner

## 2020-11-13 DIAGNOSIS — Z515 Encounter for palliative care: Secondary | ICD-10-CM

## 2020-11-13 DIAGNOSIS — G1221 Amyotrophic lateral sclerosis: Secondary | ICD-10-CM

## 2020-11-13 DIAGNOSIS — Z7189 Other specified counseling: Secondary | ICD-10-CM | POA: Diagnosis not present

## 2020-11-13 NOTE — Progress Notes (Signed)
[facility] Palliative Care Consult Note Telephone: [phone]  Fax: [phone]  PATIENT NAME: Carol Chavez [address] [place] [address] [phone] (home)  DOB: 15-Jul-1946 MRN: [id number]  PRIMARY CARE PROVIDER:    Abner Greenspan, MD,  [place] [address] [phone]  REFERRING PROVIDER:   Abner Greenspan, MD [address] [place],  [place] [address] [phone]  RESPONSIBLE PARTY:   Extended Emergency Contact Information Primary Emergency Contact: [facility] Address: [place], [place] [address] [place] Work Phone: [phone] Mobile Phone: [phone] Relation: Spouse Secondary Emergency Contact: Knippel,Betsy Mobile Phone: [phone] Relation: Daughter  I met face to face with patient and daughter [name] in home.  ASSESSMENT AND RECOMMENDATIONS:   Advance Care Planning: Goal of care: Patient's goal of care is function. Patient desires to maintain as much function as she can. Directives: Signed DNR and MOST form in home and on [facility] EMR. Details of MOST form include; limited additional intervention, antibiotics if indicated, IV fluids for a defined trial period, feeding tube long-term if indicated.   Symptom Management: Patient with recent diagnosis of ALS followed by ALS clinic at [facility], patient of Dr. Vivianne Master, last visit was via Tele health on 12/01/202. Anxiety: Family report patient had some anxiety related to her Dog having a seizure yesterday. Daughter [name] administered half tablet of Lorazepam 0.58m with relief. ERaquel Sarnareported med seemed to calm patient down through out the day and appeared to relief her dyspnea. ERaquel Sarnaasked if patient may take med for dyspnea. Advised that patient may use the Lorazepam as needed for increased dyspnea. Patient make take whole tablet, family to hold med for sedation.  Dyspnea:Patient continues to use Trilogy  during sleep and a cough assist device. Patient report the complaint of air leak at last visit has been resolved, her mask was changed from small to medium size. Patient noted with increased work of breathing during visit today, appeared dyspneic. Patient report dyspena on extertion and at times at night. Report supplemental oxygen tend to help. Family report SOB increased in last 3 weeks, report patient having difficulty catching her breath especially after an activity like taking a shower. She now showers every other day. Patient now uses oxygen during the day, she uses 2L during the day and 4L at night. No report of fever, chills, or acute cough. Recommendation: [facility] on patient's management. Dysphagia: MBSS from 07/23/2020 showed moderate dysphagia. Patient was on Dysphagia 4 diet, now takes nothing by mouth except ice chips. She is on enteral feeding via PEG tube, she is on bolus feeds 5 times a day, she is currently on 2331mof Jevity 1.5 cal with Fiber, she is intolerant to Osmolite (causes diarrhea). Her PEG tube was placed on 08/21/2020. Patient receives free water between feedings, amount was recently increased due to concern for dehydration. Patient report increased salivation, she is on Glycopyrrolate without relief. Patient uses a spit cup, to relief her self. Daughter report Scopol[facility]. Patient report sleeping well at night, wakes up mostly once at night to use the bathroom. She is using Curcumin herbal supplement twice a day, report supplement was recommended by a [place] part of medical trial. Family said they were told Curcumin could help slow  the progression of the disease. Family bought the supplement of [place]. Patient appeared to be tolerating the supplement without any reported issues. Recommendation: May consider Scopolamine patch if it is  approved by Dr. Vivianne Master. Patient to continue Biotin oral rinse to relief dry mouth, may consider using the oral spray formulation.   Follow up Palliative Care Visit: Palliative care will continue to follow for goals of care clarification and symptom management. Return in about 4-6 weeks or prn.  Family /Caregiver/Community Supports: Patient lives at home with her husband. She has 4 children, 3 daughters and one son. Patient's husband is a Visual merchandiser at [employer]. Her children and husband take turns daily to assist in her care.Family working with social work to get some assistance with personal care for patient.   Cognitive / Functional decline: Patient alert and awake, able to make her own decisions. She is dependent on manual wheelchair for ambulation, now dependent on family to wheel her around and for bathroom assistance, she is continent of bowel and bladder. She requiresassistance with her bathing and dressing. Patient has a voice amplifierwhich she has not started using, voice noted to be softer today but clear. She receiveshome health, PT and ST from [facility]. OT has been discontinued, PT comes once a week, while ST comes twice a week. [facility] nurse comes once a week. No report of recent falls. Family report using gait belt for her transfers.  I spent 60 minutes providing this consultation, time includes time spent with patient and family, chart review, provider coordination, and documentation. More than 50% of the time in this consultation was spent counseling and coordinating communication.   CHIEF COMPLAINT: Dyspnea  HISTORY OF PRESENT ILLNESS:Carol J Weheis a 74 y.o.year old femalewith multiple medical problems including motor neuron disease, dysphagia and dysarthria related toALS, anxiety, hearing loss, HLD, peripheral neuropathy. Patient with progression of disease, followed by Dr. Vivianne Master of [facility] ALS clinic. Palliative Care was asked to help address advance care  planning and goals of care. This is a follow up visit form 10/16/2020.  CODE STATUS: DNR  PPS: 50%  HOSPICE ELIGIBILITY/DIAGNOSIS: TBD  PHYSICAL EXAM / ROS:   Current and past weights: 102.4lb up from 99.5lbs at last visit a month ago. Ht 32f3", BMI 18.1kg/m2 General: NAD, frail appearing, thin, sitting in wheelchair in her kitchen participating in discussion Cardiovascular: denied chest pain, denied palpitation, no edema, S1S2 normal Pulmonary: no cough, dyspneic, 95% on room air GI:  NPO, denied constipation, continent of bowel GU: denies dysuria, continent of urine MSK: no joint and ROM abnormalities, wheelchair dependent Skin: no rashes or wounds reported or noted on exposed. Neurological: Weakness, but otherwise nonfocal  PAST MEDICAL HISTORY:  Past Medical History:  Diagnosis Date  . Balance problem   . Foot drop, left   . GERD (gastroesophageal reflux disease)   . Memory changes    pt denies  . Vitamin D deficiency    pt denies    SOCIAL HX:  Social History   Tobacco Use  . Smoking status: Never Smoker  . Smokeless tobacco: Never Used  Substance Use Topics  . Alcohol use: Yes    Comment: occ   FAMILY HX:  Family History  Problem Relation Age of Onset  . Breast cancer Mother    ALLERGIES: No Known Allergies    PERTINENT MEDICATIONS:  Outpatient Encounter Medications as of 11/13/2020  Medication Sig  . Cholecalciferol (VITAMIN D PO) Take 3 capsules by mouth daily.   .Marland Kitchen  ibuprofen (ADVIL) 200 MG tablet Take 600 mg by mouth 2 (two) times daily.  Marland Kitchen LORazepam (ATIVAN) 0.5 MG tablet Take 1 tablet (0.5 mg total) by mouth every 6 (six) hours as needed for anxiety.  Marland Kitchen MAGNESIUM PO Take 2 tablets by mouth 2 (two) times daily.   . Morphine Sulfate (MORPHINE CONCENTRATE) 10 MG/0.5ML SOLN concentrated solution Take 0.5 mLs (10 mg total) by mouth every 2 (two) hours as needed for moderate pain, severe pain or shortness of breath.  . riluzole (RILUTEK) 50 MG tablet Take 50  mg by mouth 2 (two) times daily.  Marland Kitchen VITAMIN E PO Take 1 tablet by mouth daily.   No facility-administered encounter medications on file as of 11/13/2020.     Jari Favre, DNP, AGPCNP-BC

## 2020-11-16 DIAGNOSIS — F419 Anxiety disorder, unspecified: Secondary | ICD-10-CM | POA: Diagnosis not present

## 2020-11-16 DIAGNOSIS — R1312 Dysphagia, oropharyngeal phase: Secondary | ICD-10-CM | POA: Diagnosis not present

## 2020-11-16 DIAGNOSIS — J9611 Chronic respiratory failure with hypoxia: Secondary | ICD-10-CM | POA: Diagnosis not present

## 2020-11-16 DIAGNOSIS — R053 Chronic cough: Secondary | ICD-10-CM | POA: Diagnosis not present

## 2020-11-18 DIAGNOSIS — G1221 Amyotrophic lateral sclerosis: Secondary | ICD-10-CM | POA: Diagnosis not present

## 2020-11-21 ENCOUNTER — Other Ambulatory Visit: Payer: Self-pay

## 2020-11-21 ENCOUNTER — Other Ambulatory Visit: Payer: Medicare Other | Admitting: Nurse Practitioner

## 2020-11-21 DIAGNOSIS — Z515 Encounter for palliative care: Secondary | ICD-10-CM

## 2020-11-21 DIAGNOSIS — R0609 Other forms of dyspnea: Secondary | ICD-10-CM | POA: Diagnosis not present

## 2020-11-21 NOTE — Progress Notes (Addendum)
[facility] Palliative Care Consult Note Telephone: [phone]  Fax: [phone]  PATIENT NAME: Carol Chavez [address] [place] [address] [phone] (home)  DOB: 1946/12/05 MRN: [id number]  PRIMARY CARE PROVIDER:    Abner Greenspan, MD,  [place] [address] [phone]  REFERRING PROVIDER:   Abner Greenspan, MD [address] [place],  [place] [address] [phone]  RESPONSIBLE PARTY:   Extended Emergency Contact Information Primary Emergency Contact: [facility] Address: [place], [place] [address] [place] Work Phone: [phone] Mobile Phone: [phone] Relation: Spouse Secondary Emergency Contact: Pattillo,Betsy Mobile Phone: [phone] Relation: Daughter  I met face to face with patient and family in home.   ASSESSMENT AND RECOMMENDATIONS:    Symptom Management: This is a joint visit with Lenn Sink, Respiratory Therapist with [facility]. Patient's husband and daughter Carol Chavez present during visit. Dyspnea: Patient assessed by respiratory therapist, recommended Albuterol and hypotonic solution nebulizer. Recommend using at least three times a day. Recommended using Trilogy during the day to ease work of breathing. Patient was placed on Trilogy during the visit, work of breathing was significantly improved with the application. Patient encouraged to keep it on during the day. Patient made aware that Trilogy is for ventilation while supplemental oxygen via nasal canula is for oxygenation, she was made aware that her condition is from poor ventilation vs oxygenation. Opportunity for return demonstration of application of Nebulizer given, patient's daughter and husband verbalized understanding of procedure and steps. Patient and family made aware that Albuterol can cause tachycardia or palpitation. Opportunity for questions given, all questions answered. Family asked  how to use Morphine sulfate solution. Family report patient refuses Morphine solution, believed it is for end of life. Education on the mechanism of action of Morphine provided to patient and family. Made aware that Morphine can relief pain and can also make it easier to breath. Daughter asked if patient can take 0.74m (527m instead of 0.58m8m22m69ms ordered, daughter given permission to give half the prescribed dose but not more than the prescribed dose. Family expressed appreciation for the visit and for the education provided. Palliative care will continue to provided support to patient, family and medical team. [facility]er[facility]bulizer [facility], med to be use every 4hrs as needed for SOB.  Follow up Palliative Care Visit: Palliative care will continue to follow for goals of care clarification and symptom management. Return on 12/31/2020 or prn.  I spent 60 minutes providing this consultation, time includes time spent with patient and family, chart review, provider coordination, and documentation. More than 50% of the time in this consultation was spent counseling and coordinating communication.   CHIEF COMPLAINT: Dyspnea  History obtained from review of EMR, discussion with primary team, and interview with family, caregiver, and patient. Records reviewed and summarized bellow.  HISTORY OF PRESENT ILLNESS:Carol J Weheis a 74 y21.year old femalewith multiple medical problems including motor neuron disease, dysphagia and dysarthria related toALS, anxiety, hearing loss, HLD, peripheral neuropathy. [facility] Patient with increased work of breathing, she is breathing with accessory muscles and have frequent episode of anxiety attacks. Patient now uses supplemental oxygen during the day, continues on Trilogy at bedtime. Palliative Care was asked to help address advance care planning  and goals of  care. This is afollow upvisitform 11/13/2020.  CODE STATUS: DNR  PPS: 50%  HOSPICE ELIGIBILITY/DIAGNOSIS: yes/ ALS. Patient not ready for Hospice care, desires to continue aggressive treatment.  PHYSICAL EXAM / ROS:   General: NAD, frail appearing, thin, sitting in wheelchair in her kitchen  Cardiovascular:deniedchest pain,denied palpitation Pulmonary: no cough, increase WOB GI: NPO,deniedconstipation, continent of bowel GU: denies dysuria, continent of urine MSK: no joint and ROM abnormalities,wheelchair dependent Skin: no rashes or wounds reportedor noted on exposed. Neurological: Weakness, but otherwise nonfocal  PAST MEDICAL HISTORY:  Past Medical History:  Diagnosis Date  . Balance problem   . Foot drop, left   . GERD (gastroesophageal reflux disease)   . Memory changes    pt denies  . Vitamin D deficiency    pt denies    SOCIAL HX:  Social History   Tobacco Use  . Smoking status: Never Smoker  . Smokeless tobacco: Never Used  Substance Use Topics  . Alcohol use: Yes    Comment: occ   FAMILY HX:  Family History  Problem Relation Age of Onset  . Breast cancer Mother     ALLERGIES: No Known Allergies   PERTINENT MEDICATIONS:  Outpatient Encounter Medications as of 11/21/2020  Medication Sig  . Cholecalciferol (VITAMIN D PO) Take 3 capsules by mouth daily.   Marland Kitchen ibuprofen (ADVIL) 200 MG tablet Take 600 mg by mouth 2 (two) times daily.  Marland Kitchen LORazepam (ATIVAN) 0.5 MG tablet Take 1 tablet (0.5 mg total) by mouth every 6 (six) hours as needed for anxiety.  Marland Kitchen MAGNESIUM PO Take 2 tablets by mouth 2 (two) times daily.   . Morphine Sulfate (MORPHINE CONCENTRATE) 10 MG/0.5ML SOLN concentrated solution Take 0.5 mLs (10 mg total) by mouth every 2 (two) hours as needed for moderate pain, severe pain or shortness of breath.  . riluzole (RILUTEK) 50 MG tablet Take 50 mg by mouth 2 (two) times daily.  Marland Kitchen VITAMIN E PO Take 1 tablet by mouth daily.   No  facility-administered encounter medications on file as of 11/21/2020.    Thank you for the opportunity to participate in the care of [name]. The palliative care team will continue to follow. Please call our office at [phone] if we can be of additional assistance.  Jari Favre, DNP, AGPCNP-BC

## 2020-11-26 DIAGNOSIS — F419 Anxiety disorder, unspecified: Secondary | ICD-10-CM | POA: Diagnosis not present

## 2020-11-26 DIAGNOSIS — M21372 Foot drop, left foot: Secondary | ICD-10-CM | POA: Diagnosis not present

## 2020-11-26 DIAGNOSIS — Z9841 Cataract extraction status, right eye: Secondary | ICD-10-CM | POA: Diagnosis not present

## 2020-11-26 DIAGNOSIS — R7303 Prediabetes: Secondary | ICD-10-CM | POA: Diagnosis not present

## 2020-11-26 DIAGNOSIS — Z9981 Dependence on supplemental oxygen: Secondary | ICD-10-CM | POA: Diagnosis not present

## 2020-11-26 DIAGNOSIS — R1310 Dysphagia, unspecified: Secondary | ICD-10-CM | POA: Diagnosis not present

## 2020-11-26 DIAGNOSIS — E785 Hyperlipidemia, unspecified: Secondary | ICD-10-CM | POA: Diagnosis not present

## 2020-11-26 DIAGNOSIS — E43 Unspecified severe protein-calorie malnutrition: Secondary | ICD-10-CM | POA: Diagnosis not present

## 2020-11-26 DIAGNOSIS — J9622 Acute and chronic respiratory failure with hypercapnia: Secondary | ICD-10-CM | POA: Diagnosis not present

## 2020-11-26 DIAGNOSIS — Z431 Encounter for attention to gastrostomy: Secondary | ICD-10-CM | POA: Diagnosis not present

## 2020-11-26 DIAGNOSIS — J9621 Acute and chronic respiratory failure with hypoxia: Secondary | ICD-10-CM | POA: Diagnosis not present

## 2020-11-26 DIAGNOSIS — G1221 Amyotrophic lateral sclerosis: Secondary | ICD-10-CM | POA: Diagnosis not present

## 2020-11-26 DIAGNOSIS — G629 Polyneuropathy, unspecified: Secondary | ICD-10-CM | POA: Diagnosis not present

## 2020-11-26 DIAGNOSIS — H9190 Unspecified hearing loss, unspecified ear: Secondary | ICD-10-CM | POA: Diagnosis not present

## 2020-11-26 DIAGNOSIS — Z9842 Cataract extraction status, left eye: Secondary | ICD-10-CM | POA: Diagnosis not present

## 2020-11-26 DIAGNOSIS — Z90711 Acquired absence of uterus with remaining cervical stump: Secondary | ICD-10-CM | POA: Diagnosis not present

## 2020-12-04 DIAGNOSIS — H9190 Unspecified hearing loss, unspecified ear: Secondary | ICD-10-CM | POA: Diagnosis not present

## 2020-12-04 DIAGNOSIS — Z79899 Other long term (current) drug therapy: Secondary | ICD-10-CM | POA: Diagnosis not present

## 2020-12-04 DIAGNOSIS — E785 Hyperlipidemia, unspecified: Secondary | ICD-10-CM | POA: Diagnosis not present

## 2020-12-04 DIAGNOSIS — F419 Anxiety disorder, unspecified: Secondary | ICD-10-CM | POA: Diagnosis not present

## 2020-12-04 DIAGNOSIS — G629 Polyneuropathy, unspecified: Secondary | ICD-10-CM | POA: Diagnosis not present

## 2020-12-04 DIAGNOSIS — Z434 Encounter for attention to other artificial openings of digestive tract: Secondary | ICD-10-CM | POA: Diagnosis not present

## 2020-12-04 DIAGNOSIS — G1221 Amyotrophic lateral sclerosis: Secondary | ICD-10-CM | POA: Diagnosis not present

## 2020-12-04 DIAGNOSIS — Z4659 Encounter for fitting and adjustment of other gastrointestinal appliance and device: Secondary | ICD-10-CM | POA: Diagnosis not present

## 2020-12-06 IMAGING — CT CT ANGIO CHEST
2 of 7 series · 17 of 46 positions shown · IV contrast (APPLIED)
Comparison: 08/06/2020

CLINICAL DATA: Shortness of breath and chest pain

EXAM:
CT ANGIOGRAPHY CHEST WITH CONTRAST
TECHNIQUE: Multidetector CT imaging of the chest was performed using the
standard protocol during bolus administration of intravenous
contrast. Multiplanar CT image reconstructions and MIPs were
obtained to evaluate the vascular anatomy.
CONTRAST:  75mL OMNIPAQUE IOHEXOL 350 MG/ML SOLN

[Series 7: thins · axial · 0.66mm/px · z∈[-281,-50]mm · 14 of 373 slices shown]
[im 21/373  lung]
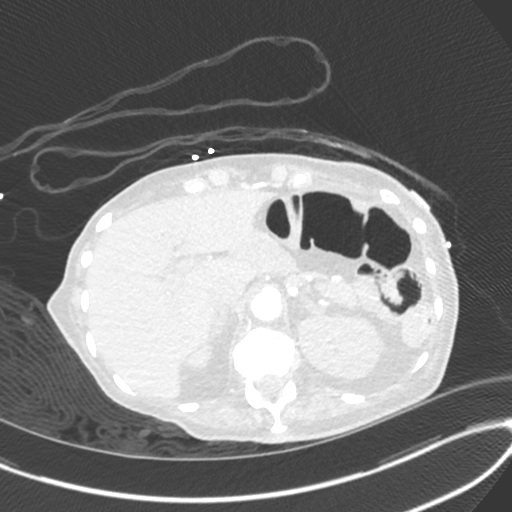
[im 42/373  soft-tissue]
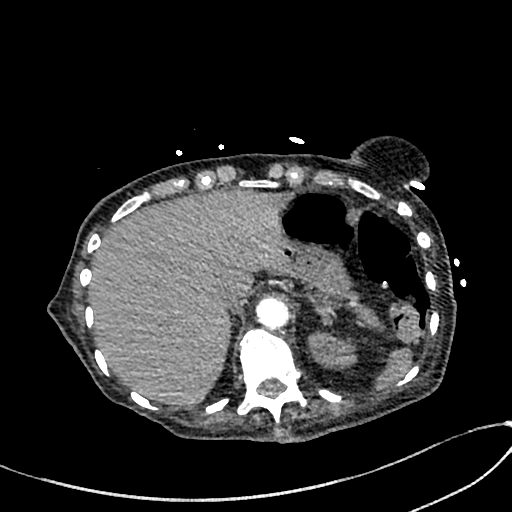
[im 83/373  lung]
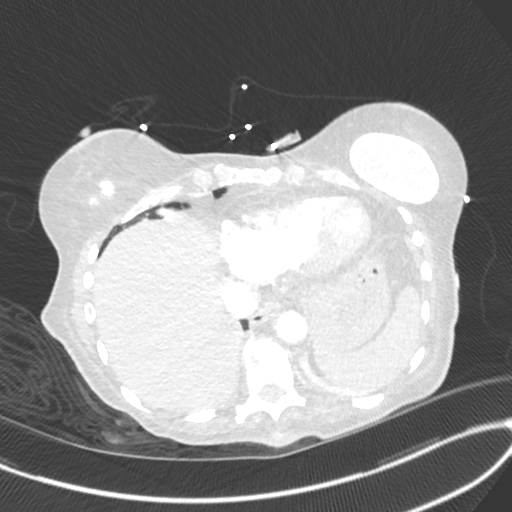
[im 104/373  soft-tissue]
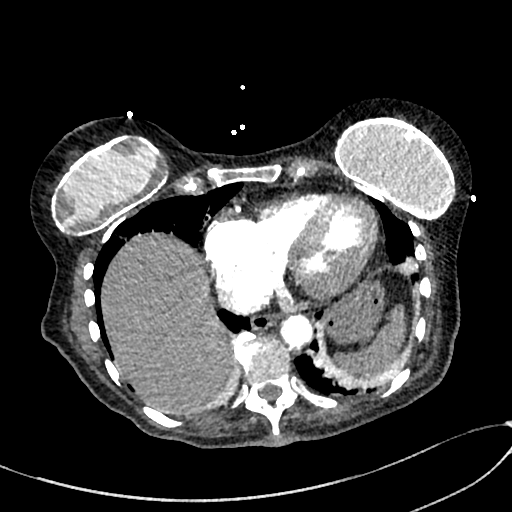
[im 125/373  lung]
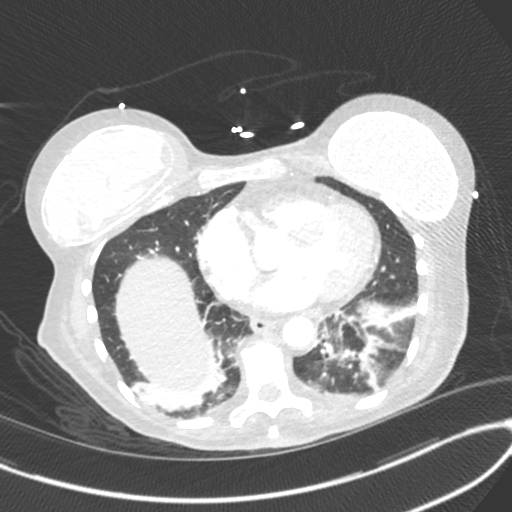
[im 145/373  soft-tissue]
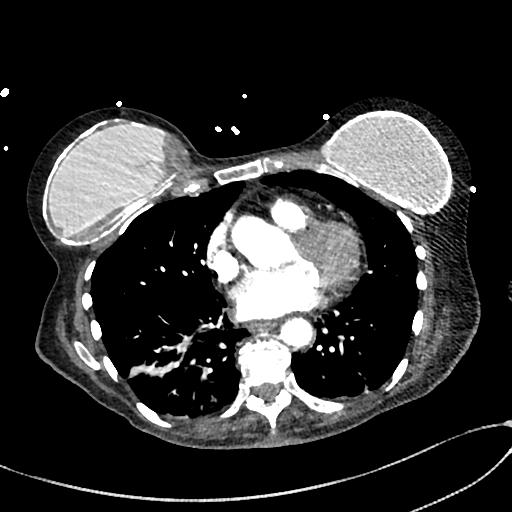
[im 166/373  lung]
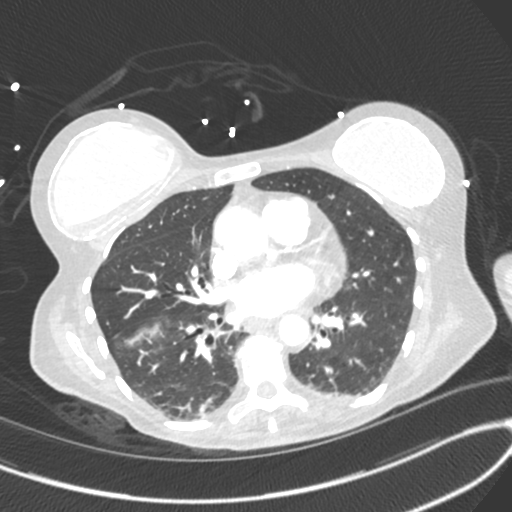
[im 207/373  soft-tissue]
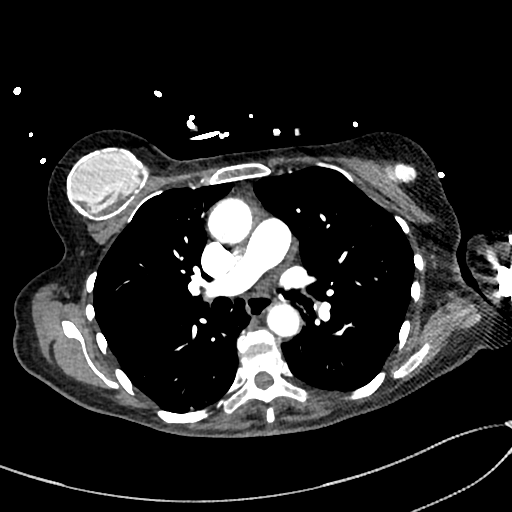
[im 228/373  lung]
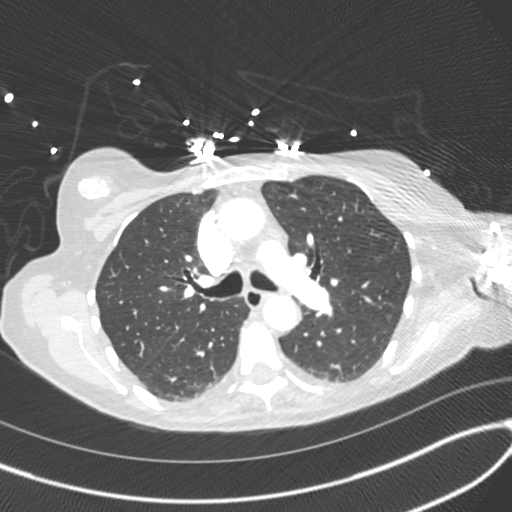
[im 249/373  soft-tissue]
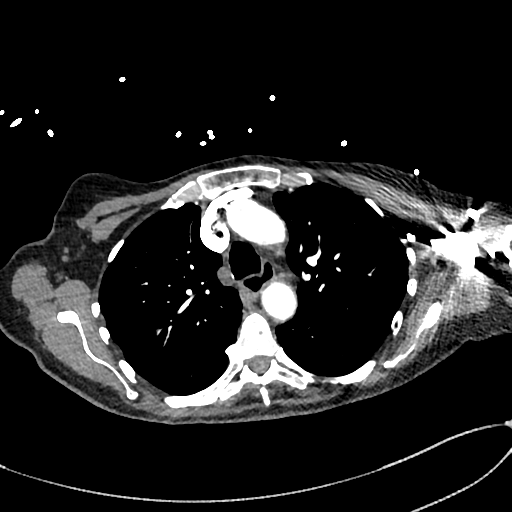
[im 269/373  lung]
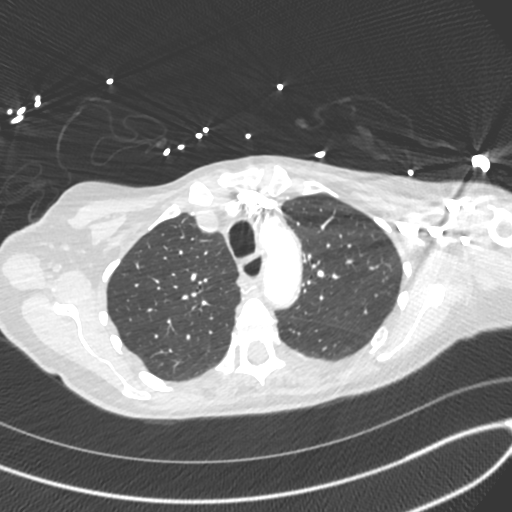
[im 290/373  soft-tissue]
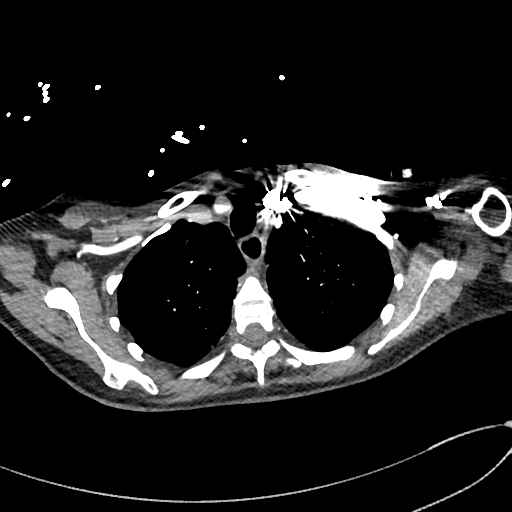
[im 331/373  lung]
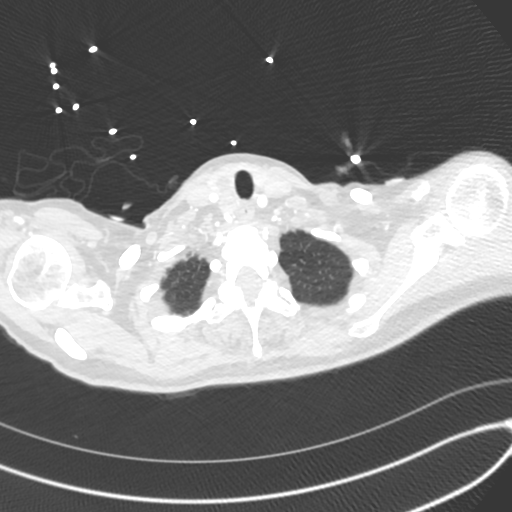
[im 352/373  soft-tissue]
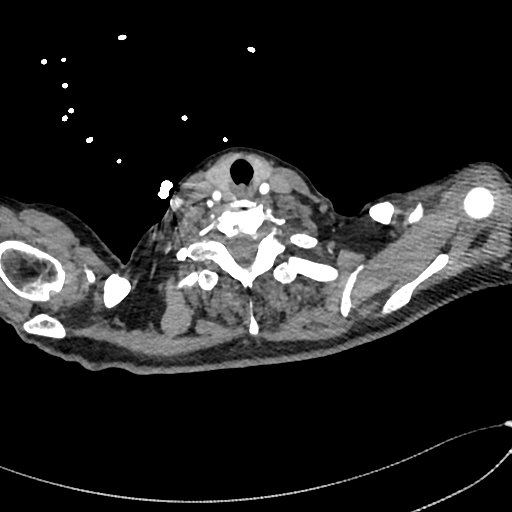

[Series 8: cor · coronal · 0.57mm/px · 3 of 118 slices shown]
[im 30/118  soft-tissue]
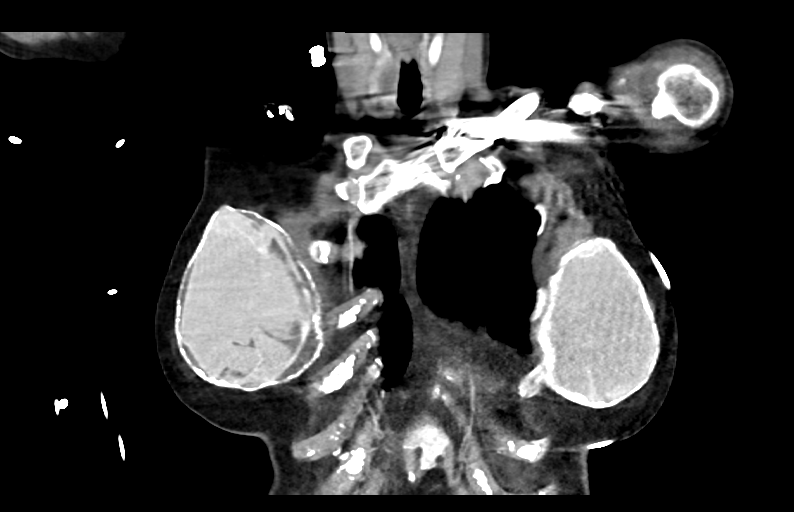
[im 59/118  soft-tissue]
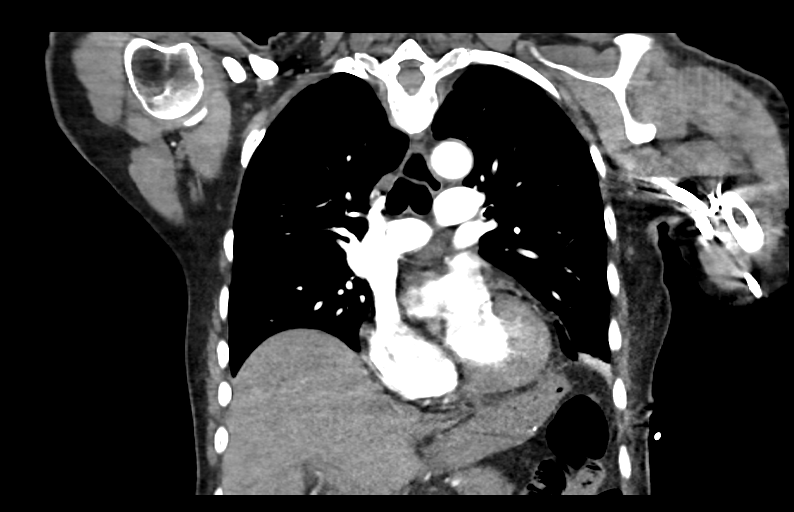
[im 88/118  soft-tissue]
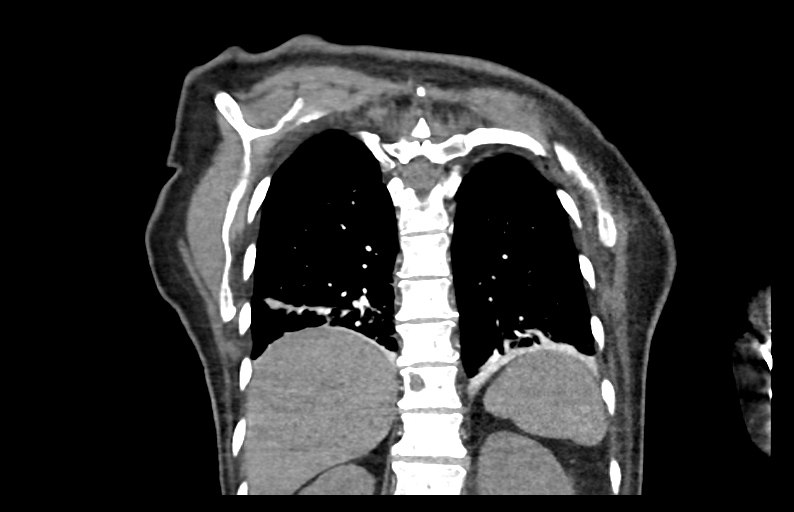

[17 of 46 positions shown; findings below may reference images not displayed]

FINDINGS: Cardiovascular: This is a technically adequate evaluation of the
pulmonary vasculature. No filling defects or pulmonary emboli.

The heart is unremarkable without pericardial effusion. No evidence
of thoracic aortic aneurysm or dissection. Minimal atherosclerosis
of the descending thoracic aorta.

Mediastinum/Nodes: Incidental 2.6 x 1.3 cm hypodense nodule right
lobe thyroid. The trachea and esophagus are unremarkable. No
pathologic adenopathy.

Lungs/Pleura: Scattered areas of atelectasis are seen within the
dependent lower lobes. No acute airspace disease, effusion, or
pneumothorax. 4 mm subpleural right upper lobe pulmonary nodule
reference image 47 of series 6. No other pulmonary nodules or
masses. Mild upper lobe predominant emphysema. Central airways are
patent.

Upper Abdomen: No acute abnormality.

Musculoskeletal: No acute or destructive bony lesions. Hemangioma
again noted within the T10 vertebral body.

Bilateral breast prostheses are identified, with heavily calcified
capsules. There is evidence of chronic intracapsular rupture on the
right. Reconstructed images demonstrate no additional findings.

Review of the MIP images confirms the above findings.
IMPRESSION: 1. No evidence of pulmonary embolus.
2. Scattered bibasilar atelectasis without acute airspace disease.
3. 4 mm right upper lobe pulmonary nodule. No follow-up needed if
patient is low-risk. Non-contrast chest CT can be considered in 12
months if patient is high-risk. This recommendation follows the
consensus statement: Guidelines for Management of Incidental
Pulmonary Nodules Detected on CT Images: From the [HOSPITAL]
4. 2.6 cm right lobe thyroid nodule. Recommend thyroid US if not
previously evaluated. (Ref: [HOSPITAL]. [DATE]):
5. Aortic Atherosclerosis (PLOH8-B6F.F) and Emphysema (PLOH8-839.2).

## 2020-12-12 DIAGNOSIS — E43 Unspecified severe protein-calorie malnutrition: Secondary | ICD-10-CM | POA: Diagnosis not present

## 2020-12-12 DIAGNOSIS — R1310 Dysphagia, unspecified: Secondary | ICD-10-CM | POA: Diagnosis not present

## 2020-12-12 DIAGNOSIS — G1221 Amyotrophic lateral sclerosis: Secondary | ICD-10-CM | POA: Diagnosis not present

## 2020-12-19 DIAGNOSIS — G1221 Amyotrophic lateral sclerosis: Secondary | ICD-10-CM | POA: Diagnosis not present

## 2020-12-20 IMAGING — US US EXTREM LOW VENOUS
1 series · 13 of 24 positions shown · non-contrast
Comparison: None.

CLINICAL DATA: Bilateral foot swelling



[Series 1: us extrem low venous · 0.06mm/px · 13 of 60 slices shown]
[im 1/60]
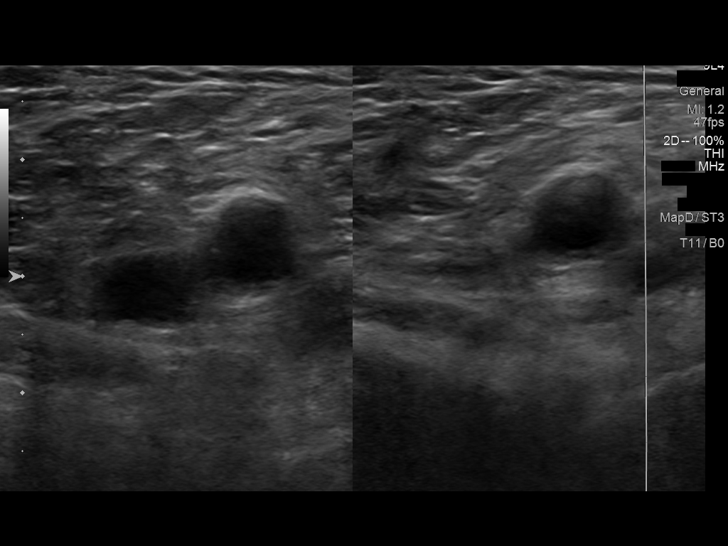
[im 6/60]
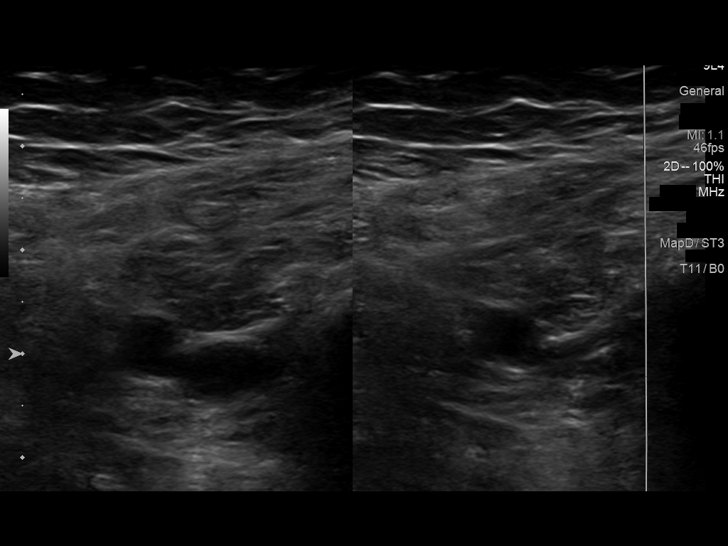
[im 11/60]
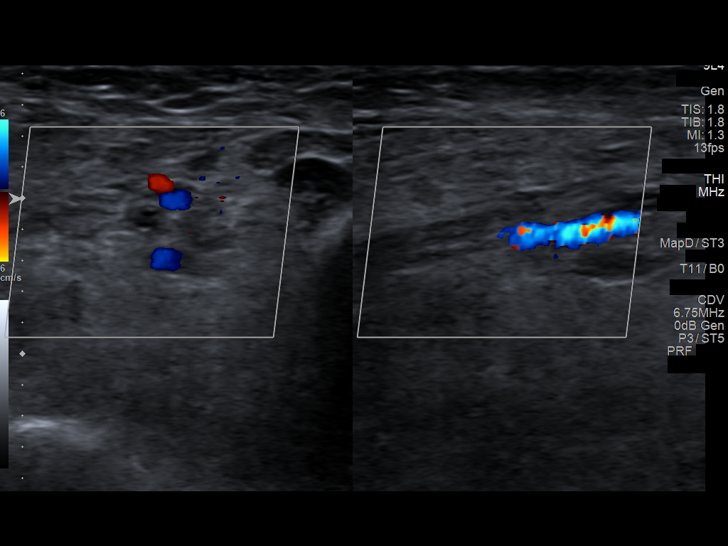
[im 16/60]
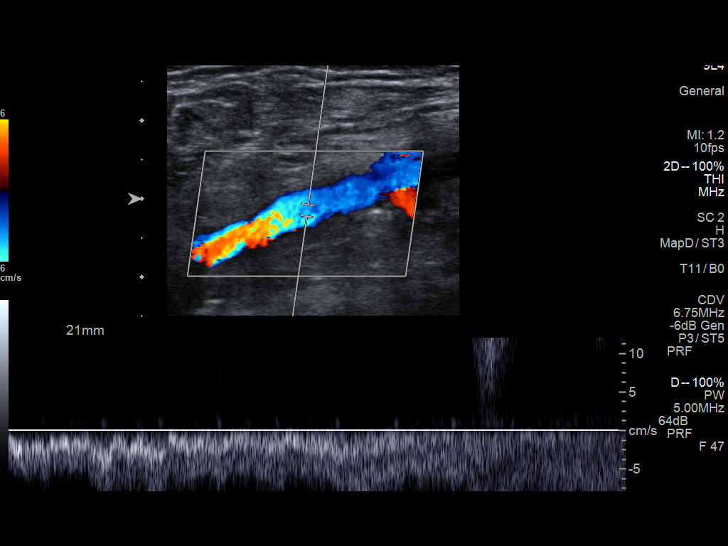
[im 21/60]
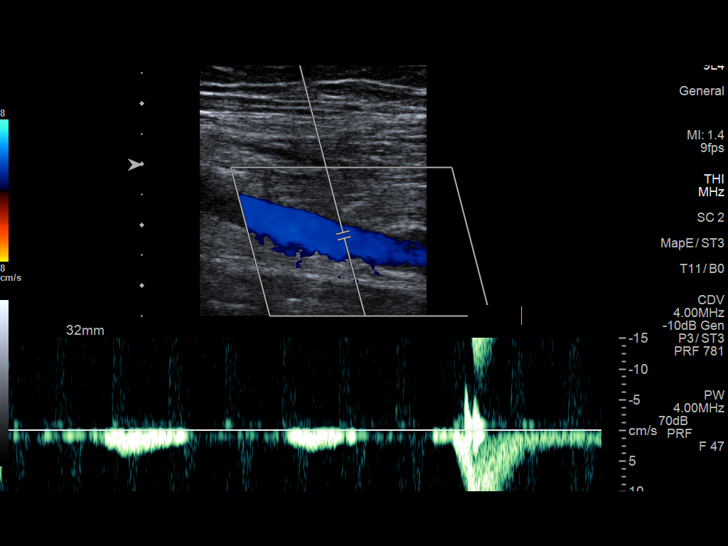
[im 26/60]
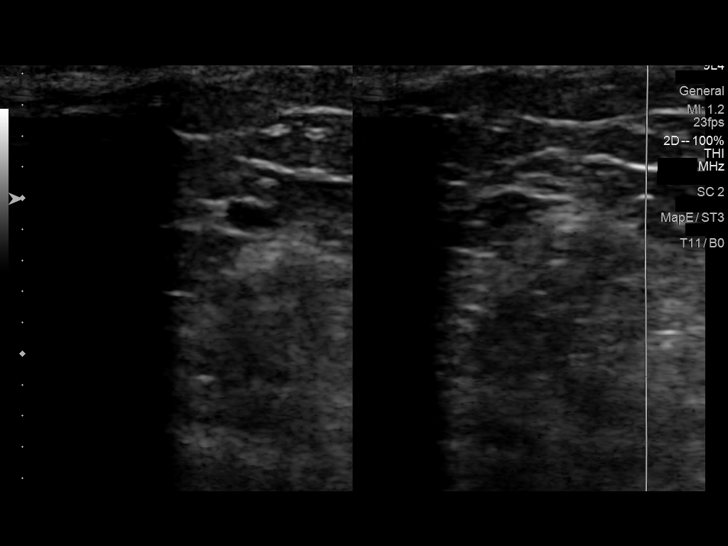
[im 31/60]
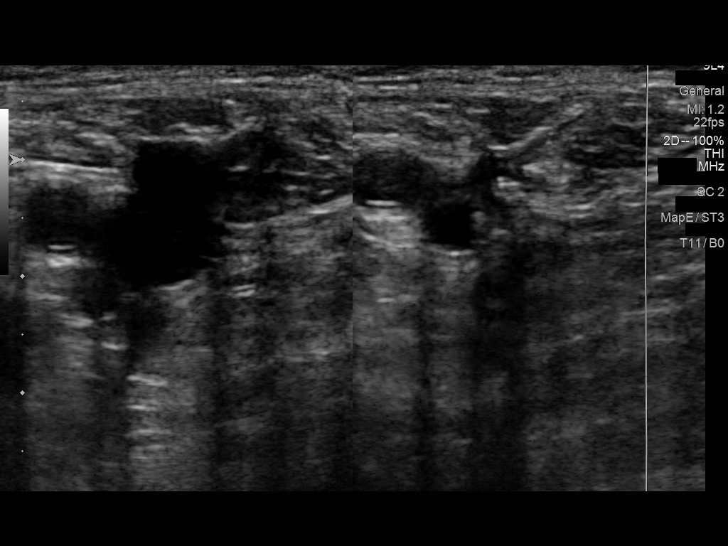
[im 34/60]
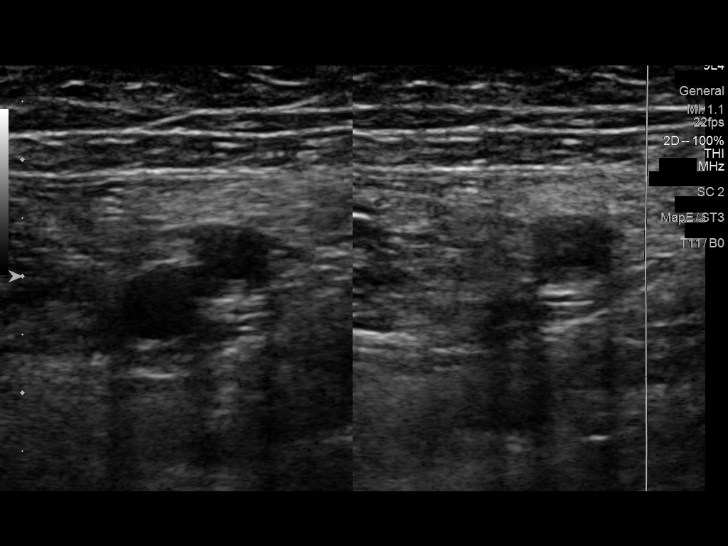
[im 39/60]
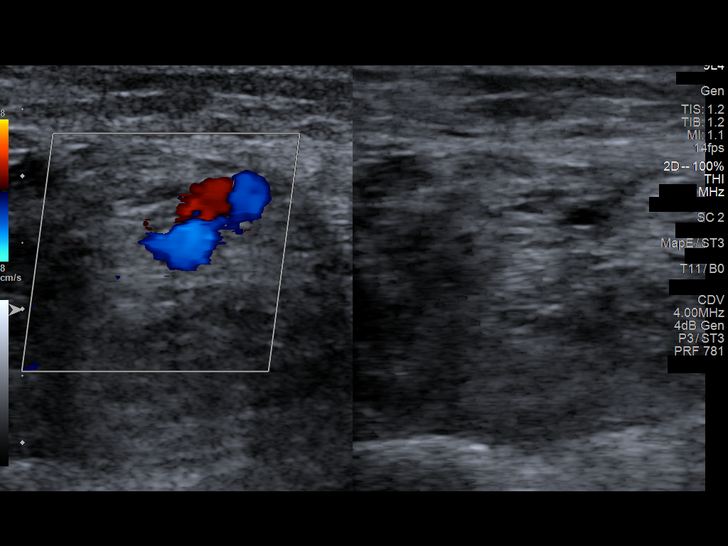
[im 44/60]
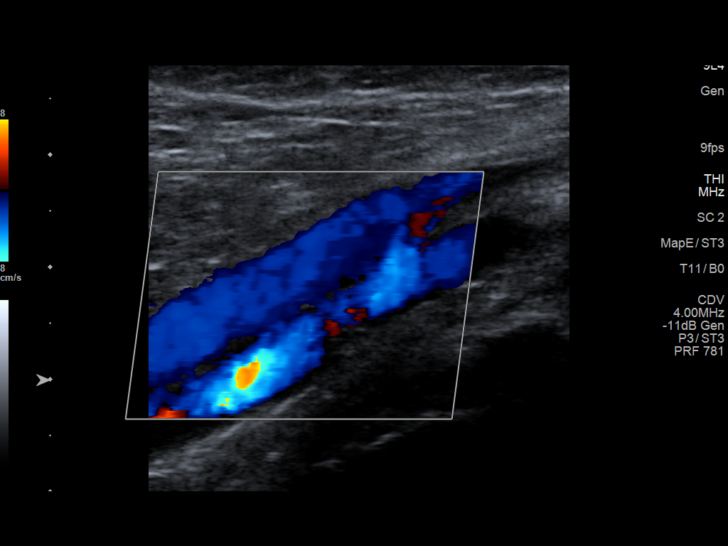
[im 49/60]
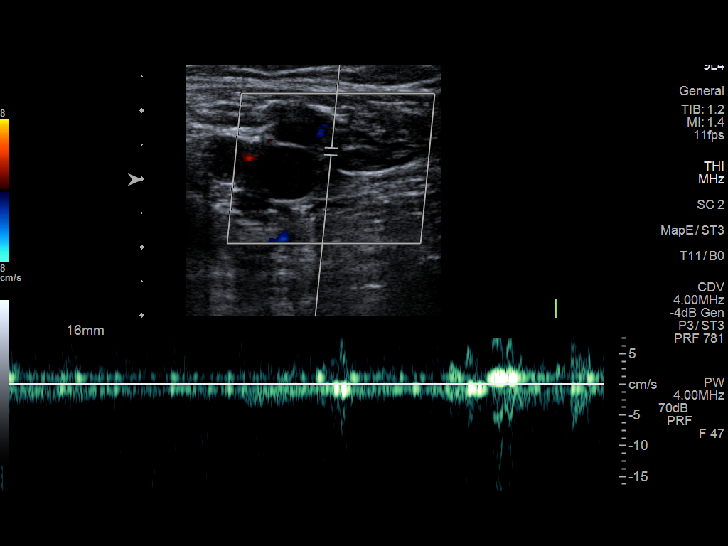
[im 54/60]
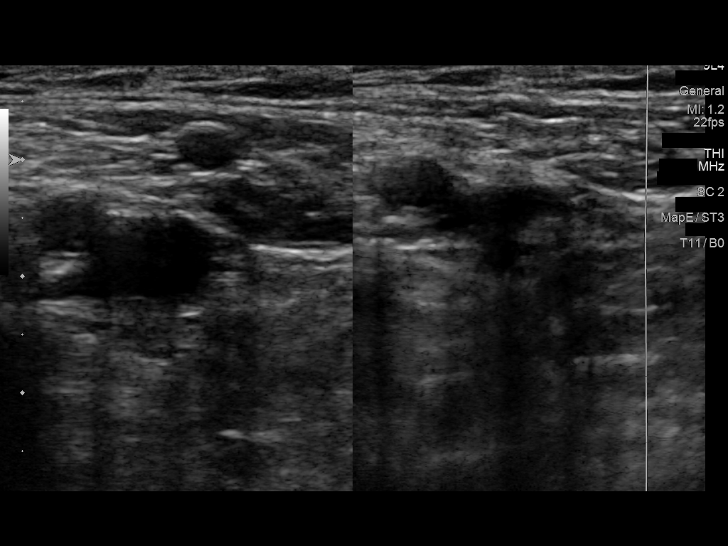
[im 60/60]
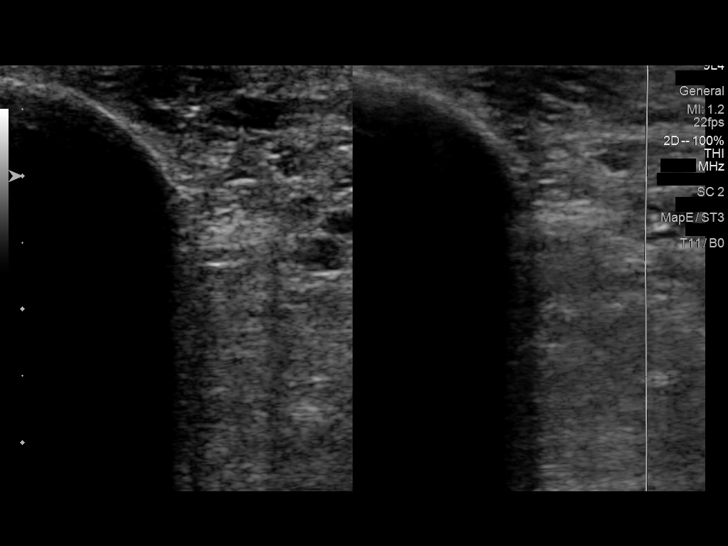

[13 of 24 positions shown; findings below may reference images not displayed]

FINDINGS: RIGHT LOWER EXTREMITY

Common Femoral Vein: No evidence of thrombus. Normal
compressibility, respiratory phasicity and response to augmentation.

Saphenofemoral Junction: No evidence of thrombus. Normal
compressibility and flow on color Doppler imaging.

Profunda Femoral Vein: No evidence of thrombus. Normal
compressibility and flow on color Doppler imaging.

Femoral Vein: No evidence of thrombus. Normal compressibility,
respiratory phasicity and response to augmentation.

Popliteal Vein: No evidence of thrombus. Normal compressibility,
respiratory phasicity and response to augmentation.

Calf Veins: No evidence of thrombus. Normal compressibility and flow
on color Doppler imaging.

Superficial Great Saphenous Vein: No evidence of thrombus. Normal
compressibility.

Venous Reflux:  None.

Other Findings:  None.

LEFT LOWER EXTREMITY

Common Femoral Vein: No evidence of thrombus. Normal
compressibility, respiratory phasicity and response to augmentation.

Saphenofemoral Junction: No evidence of thrombus. Normal
compressibility and flow on color Doppler imaging.

Profunda Femoral Vein: No evidence of thrombus. Normal
compressibility and flow on color Doppler imaging.

Femoral Vein: No evidence of thrombus. Normal compressibility,
respiratory phasicity and response to augmentation.

Popliteal Vein: No evidence of thrombus. Normal compressibility,
respiratory phasicity and response to augmentation.

Calf Veins: No evidence of thrombus. Normal compressibility and flow
on color Doppler imaging.

Superficial Great Saphenous Vein: No evidence of thrombus. Normal
compressibility.

Venous Reflux:  None.

Other Findings:  None.
IMPRESSION: No evidence of deep venous thrombosis in either lower extremity.

## 2020-12-26 DIAGNOSIS — G1221 Amyotrophic lateral sclerosis: Secondary | ICD-10-CM | POA: Diagnosis not present

## 2020-12-27 DIAGNOSIS — F419 Anxiety disorder, unspecified: Secondary | ICD-10-CM | POA: Diagnosis not present

## 2020-12-27 DIAGNOSIS — R1310 Dysphagia, unspecified: Secondary | ICD-10-CM | POA: Diagnosis not present

## 2020-12-27 DIAGNOSIS — Z931 Gastrostomy status: Secondary | ICD-10-CM | POA: Diagnosis not present

## 2020-12-27 DIAGNOSIS — G1221 Amyotrophic lateral sclerosis: Secondary | ICD-10-CM | POA: Diagnosis not present

## 2020-12-27 DIAGNOSIS — E785 Hyperlipidemia, unspecified: Secondary | ICD-10-CM | POA: Diagnosis not present

## 2020-12-27 DIAGNOSIS — R471 Dysarthria and anarthria: Secondary | ICD-10-CM | POA: Diagnosis not present

## 2020-12-27 DIAGNOSIS — R0689 Other abnormalities of breathing: Secondary | ICD-10-CM | POA: Diagnosis not present

## 2021-01-20 ENCOUNTER — Telehealth: Payer: Self-pay

## 2021-01-20 NOTE — Telephone Encounter (Signed)
Yes, would very much appreciate that and I am so sorry for their loss. Please let me know if there is anything I can do for them.

## 2021-01-20 NOTE — Telephone Encounter (Signed)
Carol Chavez(DPR not signed) left v/m requesting cb about pt; I called Carol Chavez  And she said she wanted pt name removed from our office due to pts death on 01/18/21. I offered condolences for the loss of her mother. Pt last saw Dr Milinda Antis 04/23/2020. Carol Chavez wanted to know if Dr Milinda Antis would have some time for Carol Chavez's family to bring by  Some educational information about ALS for Dr Milinda Antis and her future patient care. Carol Chavez said the family is offering this material to all doctors that helped with their mothers care. I spoke with Carol Herter RN and she said the clinic would be glad to receive the materials to educate staff. Carol Chavez request cb after DR Milinda Antis reviews this note.

## 2021-01-21 NOTE — Telephone Encounter (Signed)
Left VM letting daughter know okay to drop off info and advise of Dr. Royden Purl comments on VM

## 2021-02-04 DEATH — deceased
